# Patient Record
Sex: Male | Born: 1935 | Race: White | Hispanic: No | Marital: Married | Smoking: Never smoker
Health system: Southern US, Community
[De-identification: ages and names within clinical notes are randomized; demographics above are authoritative.]

## PROBLEM LIST (undated history)

## (undated) DIAGNOSIS — F0390 Unspecified dementia without behavioral disturbance: Secondary | ICD-10-CM

---

## 2000-06-07 ENCOUNTER — Other Ambulatory Visit: Admission: RE | Admit: 2000-06-07 | Discharge: 2000-06-07 | Payer: Self-pay | Admitting: Internal Medicine

## 2000-06-07 ENCOUNTER — Encounter (INDEPENDENT_AMBULATORY_CARE_PROVIDER_SITE_OTHER): Payer: Self-pay | Admitting: Specialist

## 2004-04-30 ENCOUNTER — Ambulatory Visit: Payer: Self-pay | Admitting: Internal Medicine

## 2004-07-01 ENCOUNTER — Ambulatory Visit: Payer: Self-pay | Admitting: Internal Medicine

## 2004-10-29 ENCOUNTER — Ambulatory Visit: Payer: Self-pay | Admitting: Internal Medicine

## 2005-04-29 ENCOUNTER — Ambulatory Visit: Payer: Self-pay | Admitting: Internal Medicine

## 2005-05-17 ENCOUNTER — Encounter: Admission: RE | Admit: 2005-05-17 | Discharge: 2005-05-17 | Payer: Self-pay | Admitting: Orthopaedic Surgery

## 2005-06-02 ENCOUNTER — Encounter: Admission: RE | Admit: 2005-06-02 | Discharge: 2005-06-02 | Payer: Self-pay | Admitting: Orthopaedic Surgery

## 2005-06-14 ENCOUNTER — Ambulatory Visit: Payer: Self-pay | Admitting: Internal Medicine

## 2005-06-16 ENCOUNTER — Ambulatory Visit: Payer: Self-pay | Admitting: Internal Medicine

## 2005-07-09 ENCOUNTER — Ambulatory Visit: Payer: Self-pay | Admitting: Internal Medicine

## 2005-10-28 ENCOUNTER — Ambulatory Visit: Payer: Self-pay | Admitting: Internal Medicine

## 2006-03-31 ENCOUNTER — Ambulatory Visit: Payer: Self-pay | Admitting: Internal Medicine

## 2006-06-01 ENCOUNTER — Encounter: Payer: Self-pay | Admitting: Internal Medicine

## 2006-07-01 ENCOUNTER — Ambulatory Visit: Payer: Self-pay | Admitting: Internal Medicine

## 2006-07-08 ENCOUNTER — Inpatient Hospital Stay: Payer: Self-pay | Admitting: Internal Medicine

## 2006-07-08 ENCOUNTER — Other Ambulatory Visit: Payer: Self-pay

## 2006-07-11 ENCOUNTER — Encounter: Payer: Self-pay | Admitting: Internal Medicine

## 2006-07-18 ENCOUNTER — Telehealth: Payer: Self-pay | Admitting: Internal Medicine

## 2006-07-25 ENCOUNTER — Encounter: Payer: Self-pay | Admitting: Internal Medicine

## 2006-09-14 DIAGNOSIS — H8110 Benign paroxysmal vertigo, unspecified ear: Secondary | ICD-10-CM | POA: Insufficient documentation

## 2006-09-14 DIAGNOSIS — Z860100 Personal history of colon polyps, unspecified: Secondary | ICD-10-CM | POA: Insufficient documentation

## 2006-09-14 DIAGNOSIS — M479 Spondylosis, unspecified: Secondary | ICD-10-CM | POA: Insufficient documentation

## 2006-09-14 DIAGNOSIS — F528 Other sexual dysfunction not due to a substance or known physiological condition: Secondary | ICD-10-CM

## 2006-09-14 DIAGNOSIS — J309 Allergic rhinitis, unspecified: Secondary | ICD-10-CM | POA: Insufficient documentation

## 2006-09-14 DIAGNOSIS — M4800 Spinal stenosis, site unspecified: Secondary | ICD-10-CM

## 2006-09-14 DIAGNOSIS — F411 Generalized anxiety disorder: Secondary | ICD-10-CM | POA: Insufficient documentation

## 2015-03-01 ENCOUNTER — Encounter: Payer: Self-pay | Admitting: *Deleted

## 2015-03-01 ENCOUNTER — Emergency Department: Payer: Medicare Other

## 2015-03-01 ENCOUNTER — Inpatient Hospital Stay
Admission: EM | Admit: 2015-03-01 | Discharge: 2015-03-02 | DRG: 438 | Disposition: A | Discharge status: Other Acute Inpatient Hospital | Payer: Medicare Other | Attending: Surgery | Admitting: Surgery

## 2015-03-01 DIAGNOSIS — S3690XA Unspecified injury of unspecified intra-abdominal organ, initial encounter: Secondary | ICD-10-CM | POA: Diagnosis not present

## 2015-03-01 DIAGNOSIS — K859 Acute pancreatitis without necrosis or infection, unspecified: Secondary | ICD-10-CM | POA: Diagnosis not present

## 2015-03-01 DIAGNOSIS — Z4659 Encounter for fitting and adjustment of other gastrointestinal appliance and device: Secondary | ICD-10-CM

## 2015-03-01 DIAGNOSIS — IMO0001 Reserved for inherently not codable concepts without codable children: Secondary | ICD-10-CM

## 2015-03-01 DIAGNOSIS — R58 Hemorrhage, not elsewhere classified: Secondary | ICD-10-CM

## 2015-03-01 DIAGNOSIS — R109 Unspecified abdominal pain: Secondary | ICD-10-CM | POA: Diagnosis present

## 2015-03-01 DIAGNOSIS — K8689 Other specified diseases of pancreas: Secondary | ICD-10-CM | POA: Diagnosis present

## 2015-03-01 DIAGNOSIS — K661 Hemoperitoneum: Secondary | ICD-10-CM | POA: Diagnosis present

## 2015-03-01 DIAGNOSIS — R1084 Generalized abdominal pain: Secondary | ICD-10-CM

## 2015-03-01 DIAGNOSIS — N179 Acute kidney failure, unspecified: Secondary | ICD-10-CM | POA: Diagnosis present

## 2015-03-01 DIAGNOSIS — S301XXA Contusion of abdominal wall, initial encounter: Secondary | ICD-10-CM | POA: Insufficient documentation

## 2015-03-01 DIAGNOSIS — S3692XA Contusion of unspecified intra-abdominal organ, initial encounter: Secondary | ICD-10-CM

## 2015-03-01 DIAGNOSIS — R000 Tachycardia, unspecified: Secondary | ICD-10-CM | POA: Diagnosis present

## 2015-03-01 LAB — LACTIC ACID, PLASMA
LACTIC ACID, VENOUS: 4 mmol/L — AB (ref 0.5–2.0)
Lactic Acid, Venous: 3.5 mmol/L (ref 0.5–2.0)

## 2015-03-01 LAB — CBC WITH DIFFERENTIAL/PLATELET
BASOS PCT: 0 %
Basophils Absolute: 0 10*3/uL (ref 0–0.1)
EOS ABS: 0 10*3/uL (ref 0–0.7)
Eosinophils Relative: 0 %
HEMATOCRIT: 36.7 % — AB (ref 40.0–52.0)
HEMOGLOBIN: 12 g/dL — AB (ref 13.0–18.0)
Lymphocytes Relative: 3 %
Lymphs Abs: 0.5 10*3/uL — ABNORMAL LOW (ref 1.0–3.6)
MCH: 30.2 pg (ref 26.0–34.0)
MCHC: 32.6 g/dL (ref 32.0–36.0)
MCV: 92.6 fL (ref 80.0–100.0)
MONOS PCT: 7 %
Monocytes Absolute: 1.2 10*3/uL — ABNORMAL HIGH (ref 0.2–1.0)
NEUTROS ABS: 15.3 10*3/uL — AB (ref 1.4–6.5)
NEUTROS PCT: 90 %
Platelets: 192 10*3/uL (ref 150–440)
RBC: 3.97 MIL/uL — AB (ref 4.40–5.90)
RDW: 13.6 % (ref 11.5–14.5)
WBC: 17.1 10*3/uL — AB (ref 3.8–10.6)

## 2015-03-01 LAB — COMPREHENSIVE METABOLIC PANEL
ALT: 9 U/L — AB (ref 17–63)
AST: 32 U/L (ref 15–41)
Albumin: 3.8 g/dL (ref 3.5–5.0)
Alkaline Phosphatase: 69 U/L (ref 38–126)
Anion gap: 12 (ref 5–15)
BUN: 40 mg/dL — AB (ref 6–20)
CHLORIDE: 101 mmol/L (ref 101–111)
CO2: 25 mmol/L (ref 22–32)
CREATININE: 2.18 mg/dL — AB (ref 0.61–1.24)
Calcium: 8.9 mg/dL (ref 8.9–10.3)
GFR calc Af Amer: 30 mL/min — ABNORMAL LOW (ref >60)
GFR calc non Af Amer: 26 mL/min — ABNORMAL LOW (ref >60)
Glucose, Bld: 176 mg/dL — ABNORMAL HIGH (ref 65–99)
POTASSIUM: 5 mmol/L (ref 3.5–5.1)
Sodium: 138 mmol/L (ref 135–145)
Total Bilirubin: 0.8 mg/dL (ref 0.3–1.2)
Total Protein: 6.3 g/dL — ABNORMAL LOW (ref 6.5–8.1)

## 2015-03-01 LAB — URINALYSIS COMPLETE WITH MICROSCOPIC (ARMC ONLY)
BACTERIA UA: NONE SEEN
Bilirubin Urine: NEGATIVE
Glucose, UA: NEGATIVE mg/dL
HGB URINE DIPSTICK: NEGATIVE
KETONES UR: NEGATIVE mg/dL
LEUKOCYTES UA: NEGATIVE
NITRITE: NEGATIVE
PH: 5 (ref 5.0–8.0)
PROTEIN: 30 mg/dL — AB
SPECIFIC GRAVITY, URINE: 1.016 (ref 1.005–1.030)
Squamous Epithelial / LPF: NONE SEEN

## 2015-03-01 LAB — BASIC METABOLIC PANEL
Anion gap: 8 (ref 5–15)
BUN: 37 mg/dL — ABNORMAL HIGH (ref 6–20)
CALCIUM: 8.1 mg/dL — AB (ref 8.9–10.3)
CO2: 23 mmol/L (ref 22–32)
CREATININE: 1.79 mg/dL — AB (ref 0.61–1.24)
Chloride: 108 mmol/L (ref 101–111)
GFR, EST AFRICAN AMERICAN: 38 mL/min — AB (ref >60)
GFR, EST NON AFRICAN AMERICAN: 33 mL/min — AB (ref >60)
Glucose, Bld: 144 mg/dL — ABNORMAL HIGH (ref 65–99)
Potassium: 5.4 mmol/L — ABNORMAL HIGH (ref 3.5–5.1)
Sodium: 139 mmol/L (ref 135–145)

## 2015-03-01 LAB — ABO/RH: ABO/RH(D): B NEG

## 2015-03-01 LAB — TYPE AND SCREEN
ABO/RH(D): B NEG
ANTIBODY SCREEN: NEGATIVE

## 2015-03-01 LAB — TROPONIN I: TROPONIN I: 0.03 ng/mL (ref <0.031)

## 2015-03-01 MED ORDER — DEXTROSE-NACL 5-0.45 % IV SOLN
INTRAVENOUS | Status: DC
  Administered 2015-03-01 – 2015-03-02 (×3): via INTRAVENOUS

## 2015-03-01 MED ORDER — MORPHINE SULFATE (PF) 2 MG/ML IV SOLN
2.0000 mg | INTRAVENOUS | Status: DC | PRN
  Administered 2015-03-02 (×2): 2 mg via INTRAVENOUS
  Filled 2015-03-01 (×2): qty 1

## 2015-03-01 MED ORDER — ONDANSETRON HCL 4 MG PO TABS: 4.0000 mg | ORAL_TABLET | Freq: Four times a day (QID) | ORAL | Status: DC | PRN

## 2015-03-01 MED ORDER — MORPHINE SULFATE (PF) 4 MG/ML IV SOLN
4.0000 mg | Freq: Once | INTRAVENOUS | Status: AC
  Administered 2015-03-01: 4 mg via INTRAVENOUS
  Filled 2015-03-01: qty 1

## 2015-03-01 MED ORDER — ONDANSETRON HCL 4 MG/2ML IJ SOLN: 4.0000 mg | Freq: Four times a day (QID) | INTRAMUSCULAR | Status: DC | PRN

## 2015-03-01 MED ORDER — IOHEXOL 240 MG/ML SOLN: 25.0000 mL | Freq: Once | INTRAMUSCULAR | Status: DC | PRN

## 2015-03-01 MED ORDER — IOHEXOL 240 MG/ML SOLN
25.0000 mL | Freq: Once | INTRAMUSCULAR | Status: AC | PRN
  Administered 2015-03-01: 25 mL via ORAL

## 2015-03-01 MED ORDER — PANTOPRAZOLE SODIUM 40 MG IV SOLR
40.0000 mg | Freq: Two times a day (BID) | INTRAVENOUS | Status: DC
  Administered 2015-03-01 – 2015-03-02 (×2): 40 mg via INTRAVENOUS
  Filled 2015-03-01 (×2): qty 40

## 2015-03-01 MED ORDER — SODIUM CHLORIDE 0.9 % IV SOLN
Freq: Once | INTRAVENOUS | Status: AC
  Administered 2015-03-01: 2000 mL via INTRAVENOUS

## 2015-03-01 MED ORDER — PIPERACILLIN-TAZOBACTAM 3.375 G IVPB
3.3750 g | Freq: Once | INTRAVENOUS | Status: AC
  Administered 2015-03-01: 3.375 g via INTRAVENOUS
  Filled 2015-03-01: qty 50

## 2015-03-01 MED ORDER — CLONAZEPAM 0.5 MG PO TABS
0.5000 mg | ORAL_TABLET | Freq: Two times a day (BID) | ORAL | Status: DC
  Administered 2015-03-02: 0.5 mg via ORAL
  Filled 2015-03-01: qty 1

## 2015-03-01 MED ORDER — PNEUMOCOCCAL VAC POLYVALENT 25 MCG/0.5ML IJ INJ
0.5000 mL | INJECTION | INTRAMUSCULAR | Status: DC
Start: 2015-03-02 — End: 2015-03-01

## 2015-03-01 MED ORDER — SODIUM CHLORIDE 0.9 % IV BOLUS (SEPSIS)
1000.0000 mL | Freq: Once | INTRAVENOUS | Status: AC
  Administered 2015-03-01: 1000 mL via INTRAVENOUS

## 2015-03-01 MED ORDER — INFLUENZA VAC SPLIT QUAD 0.5 ML IM SUSY: 0.5000 mL | PREFILLED_SYRINGE | INTRAMUSCULAR | Status: DC

## 2015-03-01 NOTE — Progress Notes (Signed)
Visited patient in ICU. Patient has abdominal pain but no worse and has not been medicated. No further nausea.  Neither NG tube nor Foley has been placed yet. Systolic blood pressure 155 heart rate 95 No change in exam awake alert and oriented  Wife at bedside  Discuss plans again with them and with RN awaiting placement of NG and Foley catheter as well as labs to be drawn within the next hour.

## 2015-03-01 NOTE — H&P (Signed)
Joel Mcconnell is an 82 y.o. male.    Chief Complaint: Abdominal pain  HPI: This is a 46-year-old male patient with no other serious medical problems not anticoagulated who presents with acute abdominal pain starting gradually 3 days ago it has gradually worsened. He has not had a bowel movement in 2 days had vomiting yesterday but none today he is nauseated. He's never had an episode like this before and denies fevers or chills. Denies melena or hematochezia and no hematemesis  No past medical history on file. past medical history is documented elsewhere in the chart but he denies cardiac disease hypertension myocardial infarction or stroke and is not diabetic  No past surgical history on file. he has had knee surgeries but no abdominal surgery  No family history on file. there is no significant family history Social History:  reports that he has never smoked. He does not have any smokeless tobacco history on file. He reports that he does not drink alcohol. His drug history is not on file. He worked as a Associate Professor in a city [employer] Allergies: No Known Allergies   (Not in a hospital admission)   Review of Systems  Constitutional: Negative for fever and chills.  HENT: Negative.   Eyes: Negative.   Respiratory: Negative.   Cardiovascular: Negative.   Gastrointestinal: Positive for nausea, vomiting, abdominal pain and constipation. Negative for heartburn, diarrhea, blood in stool and melena.  Genitourinary: Negative.   Musculoskeletal: Negative.   Skin: Negative.   Neurological: Negative.   Endo/Heme/Allergies: Negative.   Psychiatric/Behavioral: Negative.      Physical Exam:  BP 139/78 mmHg  Pulse 95  Temp(Src) 98.1 F (36.7 C) (Oral)  Resp 20  Ht 6' 2"  (1.88 m)  Wt 187 lb 4.8 oz (84.959 kg)  BMI 24.04 kg/m2  SpO2 94%  Physical Exam  Constitutional: He is oriented to person, place, and time and well-developed, well-nourished, and in no distress. No distress.  Elderly  in no acute distress  Heart rate has improved with fluid resuscitation from the 120s to the low 40N and systolic blood pressures 027  HENT:  Head: Normocephalic and atraumatic.  Eyes: Pupils are equal, round, and reactive to light. Right eye exhibits no discharge. Left eye exhibits no discharge. No scleral icterus.  Neck: Normal range of motion. Neck supple.  Cardiovascular: Normal rate, regular rhythm and normal heart sounds.   No murmur heard. Mild tachycardia at 94  Pulmonary/Chest: Effort normal and breath sounds normal. No respiratory distress. He has no wheezes. He has no rales.  Abdominal: Soft. He exhibits distension. There is tenderness. There is no rebound and no guarding.  Distended nontympanitic minimally tender diffusely without peritoneal signs no guarding rebound or percussion tenderness  Genitourinary: Penis normal.  Musculoskeletal: Normal range of motion. He exhibits no edema or tenderness.  Lymphadenopathy:    He has no cervical adenopathy.  Neurological: He is alert and oriented to person, place, and time.  Skin: Skin is warm and dry. No rash noted. He is not diaphoretic. No erythema.  Psychiatric: Mood and affect normal.  Vitals reviewed.       Results for orders placed or performed during the hospital encounter of 03/01/15 (from the past 48 hour(s))  Comprehensive metabolic panel     Status: Abnormal   Collection Time: 03/01/15  4:56 PM  Result Value Ref Range   Sodium 138 135 - 145 mmol/L   Potassium 5.0 3.5 - 5.1 mmol/L   Chloride 101 101 - 111  mmol/L   CO2 25 22 - 32 mmol/L   Glucose, Bld 176 (H) 65 - 99 mg/dL   BUN 40 (H) 6 - 20 mg/dL   Creatinine, Ser 2.18 (H) 0.61 - 1.24 mg/dL   Calcium 8.9 8.9 - 10.3 mg/dL   Total Protein 6.3 (L) 6.5 - 8.1 g/dL   Albumin 3.8 3.5 - 5.0 g/dL   AST 32 15 - 41 U/L   ALT 9 (L) 17 - 63 U/L   Alkaline Phosphatase 69 38 - 126 U/L   Total Bilirubin 0.8 0.3 - 1.2 mg/dL   GFR calc non Af Amer 26 (L) >60 mL/min   GFR  calc Af Amer 30 (L) >60 mL/min    Comment: (NOTE) The eGFR has been calculated using the CKD EPI equation. This calculation has not been validated in all clinical situations. eGFR's persistently <60 mL/min signify possible Chronic Kidney Disease.    Anion gap 12 5 - 15  Troponin I     Status: None   Collection Time: 03/01/15  4:56 PM  Result Value Ref Range   Troponin I 0.03 <0.031 ng/mL    Comment:        NO INDICATION OF MYOCARDIAL INJURY.   Lactic acid, plasma     Status: Abnormal   Collection Time: 03/01/15  4:56 PM  Result Value Ref Range   Lactic Acid, Venous 4.0 (HH) 0.5 - 2.0 mmol/L    Comment: CRITICAL RESULT CALLED TO, READ BACK BY AND VERIFIED WITH  [facility] WEAVER AT 1731 03/01/15 SDR   CBC with Differential     Status: Abnormal   Collection Time: 03/01/15  4:56 PM  Result Value Ref Range   WBC 17.1 (H) 3.8 - 10.6 K/uL   RBC 3.97 (L) 4.40 - 5.90 MIL/uL   Hemoglobin 12.0 (L) 13.0 - 18.0 g/dL   HCT 36.7 (L) 40.0 - 52.0 %   MCV 92.6 80.0 - 100.0 fL   MCH 30.2 26.0 - 34.0 pg   MCHC 32.6 32.0 - 36.0 g/dL   RDW 13.6 11.5 - 14.5 %   Platelets 192 150 - 440 K/uL   Neutrophils Relative % 90 %   Neutro Abs 15.3 (H) 1.4 - 6.5 K/uL   Lymphocytes Relative 3 %   Lymphs Abs 0.5 (L) 1.0 - 3.6 K/uL   Monocytes Relative 7 %   Monocytes Absolute 1.2 (H) 0.2 - 1.0 K/uL   Eosinophils Relative 0 %   Eosinophils Absolute 0.0 0 - 0.7 K/uL   Basophils Relative 0 %   Basophils Absolute 0.0 0 - 0.1 K/uL  Urinalysis complete, with microscopic     Status: Abnormal   Collection Time: 03/01/15  4:56 PM  Result Value Ref Range   Color, Urine YELLOW (A) YELLOW   APPearance CLEAR (A) CLEAR   Glucose, UA NEGATIVE NEGATIVE mg/dL   Bilirubin Urine NEGATIVE NEGATIVE   Ketones, ur NEGATIVE NEGATIVE mg/dL   Specific Gravity, Urine 1.016 1.005 - 1.030   Hgb urine dipstick NEGATIVE NEGATIVE   pH 5.0 5.0 - 8.0   Protein, ur 30 (A) NEGATIVE mg/dL   Nitrite NEGATIVE NEGATIVE   Leukocytes, UA  NEGATIVE NEGATIVE   RBC / HPF 0-5 0 - 5 RBC/hpf   WBC, UA 0-5 0 - 5 WBC/hpf   Bacteria, UA NONE SEEN NONE SEEN   Squamous Epithelial / LPF NONE SEEN NONE SEEN   Mucous PRESENT   Lactic acid, plasma     Status: Abnormal   Collection Time:  03/01/15  7:25 PM  Result Value Ref Range   Lactic Acid, Venous 3.5 (HH) 0.5 - 2.0 mmol/L    Comment: CRITICAL RESULT CALLED TO, READ BACK BY AND VERIFIED WITH IRIS GUIDRY AT 2006 ON 03/01/15 RWW   Type and screen [facility]     Status: None (Preliminary result)   Collection Time: 03/01/15  7:44 PM  Result Value Ref Range   ABO/RH(D) PENDING    Antibody Screen PENDING    Sample Expiration 03/04/2015    Ct Abdomen Pelvis Wo Contrast  03/01/2015  CLINICAL DATA:  Abdominal pain for 3 days EXAM: CT ABDOMEN AND PELVIS WITHOUT CONTRAST TECHNIQUE: Multidetector CT imaging of the abdomen and pelvis was performed following the standard protocol without IV contrast. COMPARISON:  None. FINDINGS: Lower chest:  Mild bibasilar atelectasis worse on the right Hepatobiliary: Negative Pancreas: Limited evaluation without contrast but no definite inherent abnormalities. Area of pancreatic head, adjacent duodenum difficult to evaluate due to evidence of probable hematoma in the area measuring about 6 by 5 cm. Spleen: Negative Adrenals/Urinary Tract: Adrenal glands are negative. Bilateral renal atrophy. Numerous hyper attenuating tiny renal lesions bilaterally likely complex cysts. Stomach/Bowel: Nonobstructive bowel gas pattern. Stool throughout the large bowel. 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 Reproductive: Negative Other: There is relatively hyper attenuating ascites in the abdomen and pelvis. Attenuation value is approximately 42. In the right upper quadrant, there appears to be hyper attenuating fluid surrounding the descending duodenum. Free fluid is seen in  the bilateral upper quadrants around the liver and spleen, with inflammatory change in the retroperitoneum around the pancreas and fluid extending into the lesser sac. Free fluid extends along both pericolic gutters into the pelvis. Musculoskeletal: No acute findings IMPRESSION: Moderate to large volume hemoperitoneum. Site of hemorrhage on certain but may be in the right upper quadrant near the duodenum and pancreatic head. Possibility of bleeding peptic ulcer is considered. Critical Value/emergent results were called by telephone at the time of interpretation on 03/01/2015 at 7:29 pm to Dr. Conni Slipper , who verbally acknowledged these results. Electronically Signed   By: Skipper Cliche M.D.   On: 03/01/2015 19:29   Dg Chest Portable 1 View  03/01/2015  CLINICAL DATA:  Epigastric pain.  Nausea and vomiting. EXAM: PORTABLE CHEST 1 VIEW COMPARISON:  07/08/2007 FINDINGS: Normal cardiac silhouette. There are low lung volumes which are decreased compared to prior. There is chronic elevation of LEFT hemidiaphragm. Lung bases are poorly evaluated. Upper lobes are clear IMPRESSION: 1. Interval decrease in lung volumes. 2. Upper lobes are clear. Electronically Signed   By: Suzy Bouchard M.D.   On: 03/01/2015 16:55     Assessment/Plan  cT scan is personally reviewed.  I reviewed by telephone the CT scan findings with Dr. Trula Slade vascular surgery on call who personally reviewed the films as well. We're in agreement that this is likely of venous hemorrhage that has stopped as it's been going on for 3 days with abdominal pain with a hemoglobin that is not critically low. He has responded to minimal fluid resuscitation of 2 L. His scan was noncontrast so it is difficult to identify the exact lesion but this is likely venous although it could be a pseudoaneurysm but is likely a recurrent emanating from the mesentery in the upper abdomen.  Dr. Trula Slade and I are in agreement that resuscitation and improvement in his  creatinine may allow [place] to  perform a CT and she'll with contrast or an angiogram with contrast tomorrow to better delineate the etiology of this. At this point there are no acute surgical indications but he will be admitted to the hospital ICU for observation and a nasogastric tube and Foley catheter will be placed.  This is discussed with the emergency room physician and staff as well as the family who were in agreement with this plan. Her Brabham has agreed to see the patient in the morning unless needed to be seen emergently which I do not believe is necessary. I will recheck his creatinine later tonight.   Florene Glen, MD, FACS

## 2015-03-01 NOTE — Progress Notes (Signed)
eLink Physician-Brief Progress Note Patient Name: Joel Mcconnell DOB: December 23, 1927 MRN: [id number]   Date of Service  03/01/2015  HPI/Events of Note  New patient w/ hemoperitoneum on CT imaging. Possible bleeding ulcer. BP stable & patient mildly tachy to 104. Camera chk shows patient on room air & comfortable. RN preparing to pass NGT. Vascular Surgery Aware as well.  eICU Interventions  Plan as per Admitting Physician.     Intervention Category Evaluation Type: New Patient Evaluation  Lawanda Cousins 03/01/2015, 11:45 PM

## 2015-03-01 NOTE — ED Notes (Signed)
Dr Excell Seltzer in to see family.  This nurse confirmed if any further questions the family had.  Pt resting comfortably.  Bair Hugger in place.  Lactic level given to Dr Darnelle Catalan.

## 2015-03-01 NOTE — ED Notes (Signed)
Per EMS report, patient has c/o mid-abdominal pain for 3 days. Patient is unable to remember address, allergies, had difficulty writing name, but did remember name and birthday. Per EMS report patient does not have a history of dementia. Patient c/o RLQ pain when moved to stretcher. Patient  Reports vomiting x2.

## 2015-03-01 NOTE — ED Provider Notes (Signed)
[facility] Emergency Department Provider Note  ____________________________________________  Time seen: Approximately 4:49 PM  I have reviewed the triage vital signs and the nursing notes.   HISTORY  Chief Complaint Abdominal Pain    HPI Joel Mcconnell is a 82 y.o. male who complains of 3 days of upper abdominal pain. He's had some vomiting as well. Reports pain is sharp and sometimes crampy. She denies any diarrhea. Patient denies any fever. Patient reports pain is severe at times including now. Patient cannot tell me what makes the pain better or worse.   No past medical history on file.  Patient Active Problem List   Diagnosis Date Noted  . ANXIETY 09/14/2006  . ERECTILE DYSFUNCTION 09/14/2006  . BENIGN POSITIONAL VERTIGO 09/14/2006  . ALLERGIC RHINITIS 09/14/2006  . SPONDYLOSIS 09/14/2006  . SPINAL STENOSIS 09/14/2006  . COLONIC POLYPS, HX OF 09/14/2006    No past surgical history on file.  Current Outpatient Rx  Name  Route  Sig  Dispense  Refill  . carisoprodol (SOMA) 350 MG tablet   Oral   Take 350 mg by mouth daily as needed. For muscle relaxer.         . cetirizine (ZYRTEC) 10 MG tablet   Oral   Take 10 mg by mouth daily as needed. For anxiety.         . clonazePAM (KLONOPIN) 0.5 MG tablet   Oral   Take 0.5 mg by mouth daily as needed. For anxiety.           Allergies Review of patient's allergies indicates no known allergies.  No family history on file.  Social History Social History  Substance Use Topics  . Smoking status: Never Smoker   . Smokeless tobacco: None  . Alcohol Use: No    Review of Systems Constitutional: No fever/chills Eyes: No visual changes. ENT: No sore throat. Cardiovascular: Denies chest pain. Respiratory: Denies shortness of breath. Gastrointestinal: See history of present illness Genitourinary: Negative for dysuria. Musculoskeletal: Negative for back pain. Skin: Negative for  rash. Neurological: Negative for headaches, focal weakness or numbness.  10-point ROS otherwise negative.  ____________________________________________   PHYSICAL EXAM:  VITAL SIGNS: ED Triage Vitals  Enc Vitals Group     BP 03/01/15 1628 117/78 mmHg     Pulse Rate 03/01/15 1628 109     Resp 03/01/15 1628 20     Temp 03/01/15 1628 97.9 F (36.6 C)     Temp Source 03/01/15 1628 Oral     SpO2 03/01/15 1628 95 %     Weight 03/01/15 1628 187 lb 4.8 oz (84.959 kg)     Height 03/01/15 1628  (1.88 m)     Head Cir --      Peak Flow --      Pain Score --      Pain Loc --      Pain Edu? --      Excl. in GC? --     Constitutional: Alert and oriented. Eyes: Conjunctivae are normal. PERRL. EOMI. Head: Atraumatic. Nose: No congestion/rhinnorhea. Mouth/Throat: Mucous membranes are moist.  Oropharynx non-erythematous. Neck: No stridor. Cardiovascular: Normal rate, regular rhythm. Grossly normal heart sounds.  Good peripheral circulation. Respiratory: Normal respiratory effort.  No retractions. Lungs CTAB. Gastrointestinal: Soft tender across the upper abdomen. It's tender to palpation even light palpation and percussion. No distention. No abdominal bruits. No CVA tenderness. Musculoskeletal: No lower extremity tenderness trace edema.  No joint effusions. Neurologic:  Normal speech  and language. No gross focal neurologic deficits are appreciated.  Skin:  Skin is warm, dry and intact. Patient has venous stasis changes in his legs Psychiatric: Mood and affect are normal. Speech and behavior are normal.  ____________________________________________   LABS (all labs ordered are listed, but only abnormal results are displayed)  Labs Reviewed  COMPREHENSIVE METABOLIC PANEL - Abnormal; Notable for the following:    Glucose, Bld 176 (*)    BUN 40 (*)    Creatinine, Ser 2.18 (*)    Total Protein 6.3 (*)    ALT 9 (*)    GFR calc non Af Amer 26 (*)    GFR calc Af Amer 30 (*)    All  other components within normal limits  LACTIC ACID, PLASMA - Abnormal; Notable for the following:    Lactic Acid, Venous 4.0 (*)    All other components within normal limits  CBC WITH DIFFERENTIAL/PLATELET - Abnormal; Notable for the following:    WBC 17.1 (*)    RBC 3.97 (*)    Hemoglobin 12.0 (*)    HCT 36.7 (*)    Neutro Abs 15.3 (*)    Lymphs Abs 0.5 (*)    Monocytes Absolute 1.2 (*)    All other components within normal limits  URINALYSIS COMPLETEWITH MICROSCOPIC ([facility] ONLY) - Abnormal; Notable for the following:    Color, Urine YELLOW (*)    APPearance CLEAR (*)    Protein, ur 30 (*)    All other components within normal limits  CULTURE, BLOOD (ROUTINE X 2)  CULTURE, BLOOD (ROUTINE X 2)  URINE CULTURE  TROPONIN I  LACTIC ACID, PLASMA  TYPE AND SCREEN   ____________________________________________  EKG  EKG read and interpreted by me shows sinus tachycardia rate of 106 normal axis nonspecific ST-T wave changes is possible the V1 and V2 of been reversed ____________________________________________  RADIOLOGY  Radiologist calls back and reports the patient has a complex hematoma around the duodenum and has the pancreas ____________________________________________   PROCEDURES    ____________________________________________   INITIAL IMPRESSION / ASSESSMENT AND PLAN / ED COURSE  Pertinent labs & imaging results that were available during my care of the patient were reviewed by me and considered in my medical decision making (see chart for details).   ____________________________________________   FINAL CLINICAL IMPRESSION(S) / ED DIAGNOSES  Final diagnoses:  Abdominal pain, unspecified abdominal location  Abdominal hematoma, initial encounter      Arnaldo Natal, MD 03/01/15 [phone]

## 2015-03-02 ENCOUNTER — Inpatient Hospital Stay: Payer: Medicare Other

## 2015-03-02 ENCOUNTER — Ambulatory Visit (HOSPITAL_COMMUNITY)
Admission: AD | Admit: 2015-03-02 | Discharge: 2015-03-02 | Disposition: A | Discharge status: Home / Self Care | Payer: Medicare Other | Source: Other Acute Inpatient Hospital | Attending: Surgical Oncology | Admitting: Surgical Oncology

## 2015-03-02 ENCOUNTER — Encounter: Payer: Self-pay | Admitting: Radiology

## 2015-03-02 DIAGNOSIS — R109 Unspecified abdominal pain: Secondary | ICD-10-CM | POA: Insufficient documentation

## 2015-03-02 LAB — CBC
HCT: 31.9 % — ABNORMAL LOW (ref 40.0–52.0)
Hemoglobin: 10.7 g/dL — ABNORMAL LOW (ref 13.0–18.0)
MCH: 30.6 pg (ref 26.0–34.0)
MCHC: 33.4 g/dL (ref 32.0–36.0)
MCV: 91.4 fL (ref 80.0–100.0)
PLATELETS: 158 10*3/uL (ref 150–440)
RBC: 3.49 MIL/uL — ABNORMAL LOW (ref 4.40–5.90)
RDW: 13.5 % (ref 11.5–14.5)
WBC: 19.6 10*3/uL — AB (ref 3.8–10.6)

## 2015-03-02 LAB — COMPREHENSIVE METABOLIC PANEL
ALBUMIN: 3.2 g/dL — AB (ref 3.5–5.0)
ALT: 6 U/L — ABNORMAL LOW (ref 17–63)
ANION GAP: 8 (ref 5–15)
AST: 20 U/L (ref 15–41)
Alkaline Phosphatase: 49 U/L (ref 38–126)
BUN: 33 mg/dL — ABNORMAL HIGH (ref 6–20)
CO2: 24 mmol/L (ref 22–32)
Calcium: 8.1 mg/dL — ABNORMAL LOW (ref 8.9–10.3)
Chloride: 107 mmol/L (ref 101–111)
Creatinine, Ser: 1.75 mg/dL — ABNORMAL HIGH (ref 0.61–1.24)
GFR calc non Af Amer: 34 mL/min — ABNORMAL LOW (ref >60)
GFR, EST AFRICAN AMERICAN: 39 mL/min — AB (ref >60)
GLUCOSE: 149 mg/dL — AB (ref 65–99)
Potassium: 4.6 mmol/L (ref 3.5–5.1)
SODIUM: 139 mmol/L (ref 135–145)
TOTAL PROTEIN: 5.5 g/dL — AB (ref 6.5–8.1)
Total Bilirubin: 0.6 mg/dL (ref 0.3–1.2)

## 2015-03-02 LAB — MRSA PCR SCREENING: MRSA by PCR: NEGATIVE

## 2015-03-02 LAB — AMYLASE: Amylase: 927 U/L — ABNORMAL HIGH (ref 28–100)

## 2015-03-02 LAB — HEMOGLOBIN: Hemoglobin: 10.1 g/dL — ABNORMAL LOW (ref 13.0–18.0)

## 2015-03-02 LAB — LIPASE, BLOOD: Lipase: 1255 U/L — ABNORMAL HIGH (ref 11–51)

## 2015-03-02 MED ORDER — INFLUENZA VAC SPLIT QUAD 0.5 ML IM SUSY: 0.5000 mL | PREFILLED_SYRINGE | INTRAMUSCULAR | Status: DC | PRN

## 2015-03-02 MED ORDER — IOHEXOL 350 MG/ML SOLN
80.0000 mL | Freq: Once | INTRAVENOUS | Status: AC | PRN
  Administered 2015-03-02: 80 mL via INTRAVENOUS

## 2015-03-02 MED ORDER — SODIUM CHLORIDE 0.9 % IV BOLUS (SEPSIS)
1000.0000 mL | Freq: Once | INTRAVENOUS | Status: AC
  Administered 2015-03-02: 1000 mL via INTRAVENOUS

## 2015-03-02 NOTE — Progress Notes (Addendum)
82 yr old with retroperitoneal hemorrhage, just went for CT angio this AM.  I received a call from the interventional radiologist reading the scan, Dr. Lowella Dandy,  stating he had hemorrhagic pancreatitis with increase in fluid and contrast seen in the retroperitoneum and abdomen.   Filed Vitals:   03/02/15 1000 03/02/15 1100  BP: 153/76 169/81  Pulse:  103  Temp:    Resp: 22 23     CBC Latest Ref Rng 03/02/2015 03/01/2015  WBC 3.8 - 10.6 K/uL 19.6(H) 17.1(H)  Hemoglobin 13.0 - 18.0 g/dL 10.7(L) 12.0(L)  Hematocrit 40.0 - 52.0 % 31.9(L) 36.7(L)  Platelets 150 - 440 K/uL 158 192   CMP Latest Ref Rng 03/02/2015 03/01/2015 03/01/2015  Glucose 65 - 99 mg/dL 161(W) 960(A) 540(J)  BUN 6 - 20 mg/dL 81(X) 91(Y) 78(G)  Creatinine 0.61 - 1.24 mg/dL 9.56(O) 1.30(Q) 6.57(Q)  Sodium 135 - 145 mmol/L 139 139 138  Potassium 3.5 - 5.1 mmol/L 4.6 5.4(H) 5.0  Chloride 101 - 111 mmol/L 107 108 101  CO2 22 - 32 mmol/L Calcium 8.9 - 10.3 mg/dL 8.1(L) 8.1(L) 8.9  Total Protein 6.5 - 8.1 g/dL 4.6(N) - 6.3(L)  Total Bilirubin 0.3 - 1.2 mg/dL 0.6 - 0.8  Alkaline Phos 38 - 126 U/L 49 - 69  AST 15 - 41 U/L 20 - 32  ALT 17 - 63 U/L 6(L) - 9(L)    CT angio: Large amount of fluid and inflammation centered around the pancreas and duodenum. There is concern for pancreatic necrosis in the pancreatic body. Findings are suggestive for pancreatitis. Again noted is a large amount of blood and fluid within the abdomen and pelvis. Again noted is a hematoma near the second and third portions of the duodenum. Findings are suggestive for hemorrhagic Pancreatitis.  Hemoperitoneum Increased fluid and blood products in the pelvis compared to the recent comparison examination.  No evidence for an aneurysm, pseudoaneurysm or active bleeding in the region of the pancreas or duodenum.  A/P:  . Given the fact that he is still bleeding hgb 12 to 10 with hemorrhagic pancreatitis and increased bleeding on scan, will transfer  him to a tertiary care center with hepatobiliary.  Spoke with Dr. Cleon Dew at [facility], who accepted the transfer for ICU to ICU.  Awaiting call back about bed availability.  Spoke with patient and he is agreeable to plan.

## 2015-03-02 NOTE — Discharge Summary (Signed)
Physician Discharge Summary  Patient ID: Joel Mcconnell MRN: [id number] DOB/AGE: [birth year] 82 y.o.  Admit date: 03/01/2015 Discharge date: 03/02/2015  Admission Diagnoses: Hemoperitoneum   Discharge Diagnoses:  Active Problems:   Hemoperitoneum Hemorrhagic Pancreatitis   Discharged Condition: serious  Hospital Course: 82 yr old with abdominal pain, nausea and vomiting for 3 days; was evaluated in [facility] ED where non-contrasted CT performed showing retroperitoneal hemorrhage.  Patient was admitted to the ICU by Surgical service; 3 L bolus of fluid given, tachycardia improved and creatine improved from 2.2 to 1.72.  He was taken for CT angio of the abdomen and pelvis, which revealed hemorrhagic pancreatitis with increasing retroperitoneal and intraabdominal hemorrhage.  His hgb dropped from 12.1 to 10.2 during this time as well.  Decision was made that given the continued bleeding, high risks of potential decompensation and lack of hepatobiliary specialist, he should be transferred to [facility] medical center.  Dr. Kathrynn Ducking accepted as an ICU to stepdown transfer today.    Consults: vascular surgery  Significant Diagnostic Studies: labs: Hgb 12.1 to 10.1; WBC 19; Cr. 2.2 to 1.72  Treatments: IV hydration  Discharge Exam: Blood pressure 169/81, pulse 103, temperature 98.7 F (37.1 C), temperature source Oral, resp. rate 23, height  (1.88 m), weight 186 lb 1.1 oz (84.4 kg), SpO2 96 %. General appearance: alert, cooperative and no distress Resp: clear to auscultation bilaterally Cardio: tachycardia but regular rhythm  GI: soft, epigastric and upper abdominal tenderness, no guarding, peritonitis or masses Extremities: extremities normal, atraumatic, no cyanosis or edema  Disposition: Transfer to [facility] medical center step down unit     Medication List    TAKE these medications        carisoprodol 350 MG tablet  Commonly known as:  SOMA  Take 350 mg by mouth daily as  needed. For muscle relaxer.     cetirizine 10 MG tablet  Commonly known as:  ZYRTEC  Take 10 mg by mouth daily as needed. For anxiety.     clonazePAM 0.5 MG tablet  Commonly known as:  KLONOPIN  Take 0.5 mg by mouth daily as needed. For anxiety.         Signed: Laney Potash [name] 03/02/2015, 1:11 PM

## 2015-03-02 NOTE — Progress Notes (Signed)
82 yr old with retroperitoneal hemorrhage that seem to be ongoing for about 3 days. Patient states that he is having some pain in the upper part of his abdomen but it is no worse. He denies feeling any dizziness lightheadedness nausea or vomiting.  Filed Vitals:   03/02/15 0700 03/02/15 0800  BP: 136/81 141/81  Pulse: 99   Temp: 98.7 F (37.1 C)   Resp: 24 24   I/O last 3 completed shifts: In: 1160 [I.V.:1160] Out: 1050 [Urine:900; Emesis/NG output:150] Total I/O In: 143.8 [I.V.:143.8] Out: -   PE:  Gen: NAD Res: CTAB/L Cardio: RRR Abd: soft, tender in upper abdomen, no peritoneal signs, no masses GU: foley in place Ext: 2+ pulses, no edema  CBC Latest Ref Rng 03/02/2015 03/01/2015  WBC 3.8 - 10.6 K/uL 19.6(H) 17.1(H)  Hemoglobin 13.0 - 18.0 g/dL 10.7(L) 12.0(L)  Hematocrit 40.0 - 52.0 % 31.9(L) 36.7(L)  Platelets 150 - 440 K/uL 158 192   CMP Latest Ref Rng 03/02/2015 03/01/2015 03/01/2015  Glucose 65 - 99 mg/dL 409(W) 119(J) 478(G)  BUN 6 - 20 mg/dL 95(A) 21(H) 08(M)  Creatinine 0.61 - 1.24 mg/dL 5.78(I) 6.96(E) 9.52(W)  Sodium 135 - 145 mmol/L 139 139 138  Potassium 3.5 - 5.1 mmol/L 4.6 5.4(H) 5.0  Chloride 101 - 111 mmol/L 107 108 101  CO2 22 - 32 mmol/L Calcium 8.9 - 10.3 mg/dL 8.1(L) 8.1(L) 8.9  Total Protein 6.5 - 8.1 g/dL 4.1(L) - 6.3(L)  Total Bilirubin 0.3 - 1.2 mg/dL 0.6 - 0.8  Alkaline Phos 38 - 126 U/L 49 - 69  AST 15 - 41 U/L 20 - 32  ALT 17 - 63 U/L 6(L) - 9(L)    A/P:  82 yr old male with retroperitoneal hemorrhage  I spoke with Dr. Genevive Bi of radiology, who recommended CT angiogram of the abdomen and pelvis to evaluate the source of the hemorrhage. Patient has had some acute kidney injury but creatinine now at 1.75 with a baseline of 1.4. Will give a normal saline bolus prior to administering the CT scan and will evaluate with a bleeding is coming from at that time.  My partner also spoke with the vascular surgeon, Dr. Myra Gianotti, will evaluate  him later today. We'll continue nothing by mouth an NG tube with IV fluids at this time. And we'll continue to monitor his hemoglobin serially.

## 2015-03-03 LAB — URINE CULTURE: CULTURE: NO GROWTH

## 2015-03-03 LAB — GLUCOSE, CAPILLARY: GLUCOSE-CAPILLARY: 133 mg/dL — AB (ref 65–99)

## 2015-03-07 ENCOUNTER — Encounter
Admission: RE | Admit: 2015-03-07 | Discharge: 2015-03-07 | Disposition: A | Discharge status: Home / Self Care | Payer: Medicare Other | Source: Ambulatory Visit | Attending: Internal Medicine | Admitting: Internal Medicine

## 2015-03-07 DIAGNOSIS — K859 Acute pancreatitis without necrosis or infection, unspecified: Secondary | ICD-10-CM | POA: Insufficient documentation

## 2015-03-07 LAB — CULTURE, BLOOD (ROUTINE X 2)
CULTURE: NO GROWTH
Culture: NO GROWTH

## 2015-03-11 DIAGNOSIS — K859 Acute pancreatitis without necrosis or infection, unspecified: Secondary | ICD-10-CM | POA: Diagnosis not present

## 2015-03-11 LAB — CBC WITH DIFFERENTIAL/PLATELET
BASOS ABS: 0 10*3/uL (ref 0–0.1)
Basophils Relative: 0 %
Eosinophils Absolute: 0.1 10*3/uL (ref 0–0.7)
Eosinophils Relative: 1 %
HCT: 26.1 % — ABNORMAL LOW (ref 40.0–52.0)
Hemoglobin: 8.5 g/dL — ABNORMAL LOW (ref 13.0–18.0)
LYMPHS ABS: 0.8 10*3/uL — AB (ref 1.0–3.6)
Lymphocytes Relative: 6 %
MCH: 29.1 pg (ref 26.0–34.0)
MCHC: 32.6 g/dL (ref 32.0–36.0)
MCV: 89.2 fL (ref 80.0–100.0)
MONO ABS: 1 10*3/uL (ref 0.2–1.0)
Monocytes Relative: 8 %
NEUTROS PCT: 85 %
Neutro Abs: 10.9 10*3/uL — ABNORMAL HIGH (ref 1.4–6.5)
PLATELETS: 296 10*3/uL (ref 150–440)
RBC: 2.93 MIL/uL — AB (ref 4.40–5.90)
RDW: 13.7 % (ref 11.5–14.5)
WBC: 12.8 10*3/uL — AB (ref 3.8–10.6)

## 2015-03-11 LAB — LIPID PANEL
CHOL/HDL RATIO: 4.6 ratio
Cholesterol: 88 mg/dL (ref 0–200)
HDL: 19 mg/dL — ABNORMAL LOW (ref >40)
LDL CALC: 49 mg/dL (ref 0–99)
Triglycerides: 102 mg/dL (ref <150)
VLDL: 20 mg/dL (ref 0–40)

## 2015-03-11 LAB — COMPREHENSIVE METABOLIC PANEL
ALBUMIN: 2.9 g/dL — AB (ref 3.5–5.0)
ALK PHOS: 65 U/L (ref 38–126)
ALT: 16 U/L — ABNORMAL LOW (ref 17–63)
ANION GAP: 7 (ref 5–15)
AST: 44 U/L — ABNORMAL HIGH (ref 15–41)
BUN: 19 mg/dL (ref 6–20)
CHLORIDE: 104 mmol/L (ref 101–111)
CO2: 28 mmol/L (ref 22–32)
Calcium: 8.4 mg/dL — ABNORMAL LOW (ref 8.9–10.3)
Creatinine, Ser: 1.36 mg/dL — ABNORMAL HIGH (ref 0.61–1.24)
GFR calc Af Amer: 53 mL/min — ABNORMAL LOW (ref >60)
GFR calc non Af Amer: 46 mL/min — ABNORMAL LOW (ref >60)
GLUCOSE: 98 mg/dL (ref 65–99)
Potassium: 3.4 mmol/L — ABNORMAL LOW (ref 3.5–5.1)
SODIUM: 139 mmol/L (ref 135–145)
TOTAL PROTEIN: 5.8 g/dL — AB (ref 6.5–8.1)
Total Bilirubin: 0.8 mg/dL (ref 0.3–1.2)

## 2015-03-11 LAB — LIPASE, BLOOD: LIPASE: 166 U/L — AB (ref 11–51)

## 2015-03-14 DIAGNOSIS — K859 Acute pancreatitis without necrosis or infection, unspecified: Secondary | ICD-10-CM | POA: Diagnosis not present

## 2015-03-14 LAB — COMPREHENSIVE METABOLIC PANEL
ALK PHOS: 63 U/L (ref 38–126)
ALT: 18 U/L (ref 17–63)
AST: 43 U/L — ABNORMAL HIGH (ref 15–41)
Albumin: 3.1 g/dL — ABNORMAL LOW (ref 3.5–5.0)
Anion gap: 9 (ref 5–15)
BUN: 17 mg/dL (ref 6–20)
CALCIUM: 8.5 mg/dL — AB (ref 8.9–10.3)
CO2: 27 mmol/L (ref 22–32)
CREATININE: 1.44 mg/dL — AB (ref 0.61–1.24)
Chloride: 105 mmol/L (ref 101–111)
GFR, EST AFRICAN AMERICAN: 50 mL/min — AB (ref >60)
GFR, EST NON AFRICAN AMERICAN: 43 mL/min — AB (ref >60)
Glucose, Bld: 102 mg/dL — ABNORMAL HIGH (ref 65–99)
Potassium: 3.5 mmol/L (ref 3.5–5.1)
Sodium: 141 mmol/L (ref 135–145)
Total Bilirubin: 0.8 mg/dL (ref 0.3–1.2)
Total Protein: 6 g/dL — ABNORMAL LOW (ref 6.5–8.1)

## 2015-03-14 LAB — CBC WITH DIFFERENTIAL/PLATELET
BASOS PCT: 0 %
Basophils Absolute: 0 10*3/uL (ref 0–0.1)
EOS ABS: 0.1 10*3/uL (ref 0–0.7)
EOS PCT: 1 %
HEMATOCRIT: 28.8 % — AB (ref 40.0–52.0)
HEMOGLOBIN: 9.7 g/dL — AB (ref 13.0–18.0)
LYMPHS PCT: 12 %
Lymphs Abs: 1.2 10*3/uL (ref 1.0–3.6)
MCH: 30.3 pg (ref 26.0–34.0)
MCHC: 33.7 g/dL (ref 32.0–36.0)
MCV: 89.8 fL (ref 80.0–100.0)
MONOS PCT: 9 %
Monocytes Absolute: 0.9 10*3/uL (ref 0.2–1.0)
NEUTROS ABS: 8.1 10*3/uL — AB (ref 1.4–6.5)
Neutrophils Relative %: 78 %
Platelets: 309 10*3/uL (ref 150–440)
RBC: 3.2 MIL/uL — ABNORMAL LOW (ref 4.40–5.90)
RDW: 13.7 % (ref 11.5–14.5)
WBC: 10.3 10*3/uL (ref 3.8–10.6)

## 2015-03-14 LAB — LIPASE, BLOOD: Lipase: 294 U/L — ABNORMAL HIGH (ref 11–51)

## 2016-07-23 ENCOUNTER — Emergency Department
Admission: EM | Admit: 2016-07-23 | Discharge: 2016-07-25 | Disposition: A | Discharge status: Other Acute Inpatient Hospital | Payer: Medicare Other | Attending: Emergency Medicine | Admitting: Emergency Medicine

## 2016-07-23 ENCOUNTER — Emergency Department: Payer: Medicare Other

## 2016-07-23 ENCOUNTER — Encounter: Payer: Self-pay | Admitting: Emergency Medicine

## 2016-07-23 DIAGNOSIS — F419 Anxiety disorder, unspecified: Secondary | ICD-10-CM | POA: Diagnosis not present

## 2016-07-23 DIAGNOSIS — R4182 Altered mental status, unspecified: Secondary | ICD-10-CM | POA: Diagnosis present

## 2016-07-23 DIAGNOSIS — F0391 Unspecified dementia with behavioral disturbance: Secondary | ICD-10-CM | POA: Diagnosis not present

## 2016-07-23 LAB — CBC WITH DIFFERENTIAL/PLATELET
BASOS PCT: 0 %
Basophils Absolute: 0 10*3/uL (ref 0–0.1)
EOS ABS: 0.1 10*3/uL (ref 0–0.7)
Eosinophils Relative: 1 %
HCT: 44.5 % (ref 40.0–52.0)
HEMOGLOBIN: 14.8 g/dL (ref 13.0–18.0)
Lymphocytes Relative: 12 %
Lymphs Abs: 1.2 10*3/uL (ref 1.0–3.6)
MCH: 30.2 pg (ref 26.0–34.0)
MCHC: 33.2 g/dL (ref 32.0–36.0)
MCV: 91.1 fL (ref 80.0–100.0)
MONOS PCT: 6 %
Monocytes Absolute: 0.6 10*3/uL (ref 0.2–1.0)
NEUTROS PCT: 81 %
Neutro Abs: 8.1 10*3/uL — ABNORMAL HIGH (ref 1.4–6.5)
Platelets: 226 10*3/uL (ref 150–440)
RBC: 4.89 MIL/uL (ref 4.40–5.90)
RDW: 13.1 % (ref 11.5–14.5)
WBC: 9.9 10*3/uL (ref 3.8–10.6)

## 2016-07-23 LAB — COMPREHENSIVE METABOLIC PANEL
ALT: 11 U/L — ABNORMAL LOW (ref 17–63)
ANION GAP: 7 (ref 5–15)
AST: 29 U/L (ref 15–41)
Albumin: 4.8 g/dL (ref 3.5–5.0)
Alkaline Phosphatase: 95 U/L (ref 38–126)
BILIRUBIN TOTAL: 0.9 mg/dL (ref 0.3–1.2)
BUN: 24 mg/dL — AB (ref 6–20)
CO2: 28 mmol/L (ref 22–32)
Calcium: 9.8 mg/dL (ref 8.9–10.3)
Chloride: 103 mmol/L (ref 101–111)
Creatinine, Ser: 1.4 mg/dL — ABNORMAL HIGH (ref 0.61–1.24)
GFR, EST AFRICAN AMERICAN: 51 mL/min — AB (ref >60)
GFR, EST NON AFRICAN AMERICAN: 44 mL/min — AB (ref >60)
Glucose, Bld: 108 mg/dL — ABNORMAL HIGH (ref 65–99)
POTASSIUM: 4.7 mmol/L (ref 3.5–5.1)
Sodium: 138 mmol/L (ref 135–145)
TOTAL PROTEIN: 7.4 g/dL (ref 6.5–8.1)

## 2016-07-23 LAB — ACETAMINOPHEN LEVEL

## 2016-07-23 LAB — ETHANOL: Alcohol, Ethyl (B): <5 mg/dL (ref <5)

## 2016-07-23 LAB — SALICYLATE LEVEL
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
## 2016-07-23 NOTE — ED Notes (Signed)
Pt to CT via stretcher with ODS officer and CT tech.

## 2016-07-23 NOTE — ED Notes (Signed)
ENVIRONMENTAL ASSESSMENT  Potentially harmful objects out of patient reach: Yes.  Personal belongings secured: Yes.  Patient dressed in hospital provided attire only: Yes.  Plastic bags out of patient reach: Yes.  Patient care equipment (cords, cables, call bells, lines, and drains) shortened, removed, or accounted for: Yes.  Equipment and supplies removed from bottom of stretcher: Yes.  Potentially toxic materials out of patient reach: Yes.  Sharps container removed or out of patient reach: Yes.   BEHAVIORAL HEALTH ROUNDING  Patient sleeping: No.  Patient alert and oriented: alert, oriented to self and place only.  Behavior appropriate: Yes. ; If no, describe:  Nutrition and fluids offered: Yes  Toileting and hygiene offered: Yes  Sitter present: not applicable, Q 15 min safety rounds and observation.  Law enforcement present: Yes ODS

## 2016-07-23 NOTE — ED Notes (Signed)
BEHAVIORAL HEALTH ROUNDING  Patient sleeping: No.  Patient alert and oriented: yes to his baseline Behavior appropriate: Yes. ; If no, describe:  Nutrition and fluids offered: Yes  Toileting and hygiene offered: Yes  Sitter present: not applicable, Q 15 min safety rounds and observation.  Law enforcement present: Yes ODS  
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
## 2016-07-23 NOTE — ED Notes (Signed)
Pt returned from CT °

## 2016-07-24 ENCOUNTER — Emergency Department: Payer: Medicare Other

## 2016-07-24 DIAGNOSIS — F0391 Unspecified dementia with behavioral disturbance: Secondary | ICD-10-CM | POA: Diagnosis not present

## 2016-07-24 LAB — URINE DRUG SCREEN, QUALITATIVE (ARMC ONLY)
AMPHETAMINES, UR SCREEN: NOT DETECTED
BARBITURATES, UR SCREEN: NOT DETECTED
Benzodiazepine, Ur Scrn: NOT DETECTED
COCAINE METABOLITE, UR ~~LOC~~: NOT DETECTED
Cannabinoid 50 Ng, Ur ~~LOC~~: NOT DETECTED
MDMA (ECSTASY) UR SCREEN: NOT DETECTED
METHADONE SCREEN, URINE: NOT DETECTED
OPIATE, UR SCREEN: NOT DETECTED
Phencyclidine (PCP) Ur S: NOT DETECTED
TRICYCLIC, UR SCREEN: NOT DETECTED

## 2016-07-24 LAB — URINALYSIS, COMPLETE (UACMP) WITH MICROSCOPIC
BACTERIA UA: NONE SEEN
BILIRUBIN URINE: NEGATIVE
Glucose, UA: NEGATIVE mg/dL
Hgb urine dipstick: NEGATIVE
Ketones, ur: NEGATIVE mg/dL
Leukocytes, UA: NEGATIVE
NITRITE: NEGATIVE
PH: 7 (ref 5.0–8.0)
Protein, ur: NEGATIVE mg/dL
SPECIFIC GRAVITY, URINE: 1.009 (ref 1.005–1.030)
SQUAMOUS EPITHELIAL / LPF: NONE SEEN

## 2016-07-24 NOTE — ED Notes (Signed)
BEHAVIORAL HEALTH ROUNDING Patient sleeping: Yes.   Patient alert and oriented: not applicable SLEEPING Behavior appropriate: Yes.  ; If no, describe: SLEEPING Nutrition and fluids offered: No SLEEPING Toileting and hygiene offered: NoSLEEPING Sitter present: not applicable, Q 15 min safety rounds and observation. Law enforcement present: Yes ODS 

## 2016-07-24 NOTE — Progress Notes (Signed)
Writer [place] [facility] from [place] [facility] inquiring if urine drug screen could be faxed to them. Clinicals have been faxed.   Per Lily KocherJeffrey, [facility] is reviewing referral for possible admission.  Melbourne Abtsatia Aimee Timmons, LCSWA Disposition staff 07/24/2016 11:46 AM

## 2016-07-24 NOTE — ED Notes (Signed)
Pt given meal tray.

## 2016-07-24 NOTE — ED Notes (Signed)
Pt is resting in hallway. Very polite and cooperative.

## 2016-07-24 NOTE — BH Assessment (Signed)
Spoke with Lashonda from [facility] and she stated the patient is accepted by the Dr, but needs a chest X-ray and EKG.  Spoke with Dr. Brown about placing order for chest X-Ray and EKG. 

## 2016-07-24 NOTE — ED Notes (Signed)
Pt ambulated to bathroom unassisted.

## 2016-07-24 NOTE — ED Notes (Signed)
Pt up to bathroom with slow, steady gait. Urine specimen obtained.

## 2016-07-24 NOTE — ED Notes (Signed)
Pt sleeping at this time.

## 2016-07-24 NOTE — ED Notes (Signed)
Pt sitting up in bed eating supper.

## 2016-07-24 NOTE — ED Notes (Signed)
Meal tray at bedside.  

## 2016-07-24 NOTE — ED Notes (Signed)
Pt showered

## 2016-07-24 NOTE — ED Notes (Signed)
BEHAVIORAL HEALTH ROUNDING  Patient sleeping: No.  Patient alert and oriented: yes  Behavior appropriate: Yes. ; If no, describe:  Nutrition and fluids offered: Yes  Toileting and hygiene offered: Yes  Sitter present: not applicable, Q 15 min safety rounds and observation.  Law enforcement present: Yes ODS  

## 2016-07-24 NOTE — BH Assessment (Signed)
EKG and Chest X-ray submitted to [facility]. 

## 2016-07-24 NOTE — ED Notes (Signed)
Patients wife called to check on patients status, pt gave verbal permission to speak to wife. Wife informed that [facility] is being [facility] and we are waiting on a few other things to be faxed to them and hopefully pt will be transferred today. Pt wife given phone call hours and she will call back to speak with the patient.

## 2016-07-24 NOTE — ED Notes (Signed)
Randi from APS called and informed nurse that [employer] are in the process of petitioning for guardianship of patient.  Donnal DebarRandi would like to be informed when patient is transferred [facility]  Cell phone: [phone]

## 2016-07-24 NOTE — BH Assessment (Signed)
Referral information for Psychiatric Hospitalization faxed to;    [facility] ([phone])),    [facility] ([phone]),    [facility] ([phone]),     [facility] ([phone]),    [facility] ([phone]),  

## 2016-07-24 NOTE — BH Assessment (Signed)
[facility] been [facility].  Accepting physician is Dr. Edger HouseJagan Subedi.  Call report to [phone].  Representative was [employer]issa.  ER Staff is aware of it Salley Scarlet(Nitchia, ER Sect.; Dr. Edger HouseJagan Subedi, Fanny BienQuale, ER MD & , Marylene LandAngela, Patient's Nurse)    Per chart, pt's wife has been informed by Marylene LandAngela, RN.

## 2016-07-24 NOTE — ED Notes (Signed)
Pt returned to room from xray.

## 2016-07-24 NOTE — ED Notes (Signed)
RN called TTS worker to check on the status of CXR and EKG results being faxed to [facility]. TTS worker stated that she would come make a copy of EKG and fax EKG and CXR results to [facility] so patient can be transferred.

## 2016-07-25 NOTE — ED Notes (Signed)
Transport is here to get pt. Pt given breakfast tray and informed that his ride was here to get him and to go ahead and eat first. Pt is sitting up in bed eating breakfast.

## 2016-07-25 NOTE — ED Notes (Signed)
BEHAVIORAL HEALTH ROUNDING Patient sleeping: Yes.   Patient alert and oriented: not applicable SLEEPING Behavior appropriate: Yes.  ; If no, describe: SLEEPING Nutrition and fluids offered: No SLEEPING Toileting and hygiene offered: NoSLEEPING Sitter present: not applicable, Q 15 min safety rounds and observation. Law enforcement present: Yes ODS 

## 2016-07-25 NOTE — ED Notes (Signed)

## 2016-07-25 NOTE — ED Provider Notes (Signed)
-----------------------------------------   6:35 AM on 07/25/2016 -----------------------------------------   Blood pressure (!) 119/20, pulse (!) 59, temperature 97.7 F (36.5 C), temperature source Oral, resp. rate 16, height 1.854 m (6\' 1" ), weight 81.6 kg (180 lb), SpO2 96 %.  Calm and cooperative at this time.  As per TTS note:  "[facility] been [facility].  Accepting physician is Dr. Edger HouseJagan Subedi.  Call report to [phone].  Representative was [employer]Melissa.  ER Staff is aware of it Salley Scarlet(Nitchia, ER Sect.; Dr. Edger HouseJagan Subedi, Fanny BienQuale, ER MD & , Marylene LandAngela, Patient's Nurse)    Per chart, pt's wife has been informed by Marylene LandAngela, RN."   Intolerant documentation has been completed, but physician reassessment will need to be repeated prior to transport.        Loleta RoseForbach, [place], MD 07/25/16 [phone]

## 2016-07-25 NOTE — ED Notes (Signed)
Pt belongings bag 1 of 1 [facility] and placed at quad RN desk for transfer.

## 2016-08-05 ENCOUNTER — Emergency Department
Admission: EM | Admit: 2016-08-05 | Discharge: 2016-08-06 | Disposition: A | Discharge status: Other Acute Inpatient Hospital | Payer: Medicare Other | Attending: Emergency Medicine | Admitting: Emergency Medicine

## 2016-08-05 ENCOUNTER — Encounter: Payer: Self-pay | Admitting: Emergency Medicine

## 2016-08-05 DIAGNOSIS — F0281 Dementia in other diseases classified elsewhere with behavioral disturbance: Secondary | ICD-10-CM

## 2016-08-05 DIAGNOSIS — F419 Anxiety disorder, unspecified: Secondary | ICD-10-CM | POA: Diagnosis not present

## 2016-08-05 DIAGNOSIS — Z79899 Other long term (current) drug therapy: Secondary | ICD-10-CM | POA: Insufficient documentation

## 2016-08-05 DIAGNOSIS — G309 Alzheimer's disease, unspecified: Secondary | ICD-10-CM | POA: Diagnosis not present

## 2016-08-05 DIAGNOSIS — F03918 Unspecified dementia, unspecified severity, with other behavioral disturbance: Secondary | ICD-10-CM

## 2016-08-05 DIAGNOSIS — F0391 Unspecified dementia with behavioral disturbance: Secondary | ICD-10-CM | POA: Insufficient documentation

## 2016-08-05 DIAGNOSIS — R451 Restlessness and agitation: Secondary | ICD-10-CM | POA: Diagnosis not present

## 2016-08-05 LAB — URINE DRUG SCREEN, QUALITATIVE (ARMC ONLY)
Amphetamines, Ur Screen: NOT DETECTED
BARBITURATES, UR SCREEN: NOT DETECTED
BENZODIAZEPINE, UR SCRN: NOT DETECTED
Cannabinoid 50 Ng, Ur ~~LOC~~: NOT DETECTED
Cocaine Metabolite,Ur ~~LOC~~: NOT DETECTED
MDMA (Ecstasy)Ur Screen: NOT DETECTED
METHADONE SCREEN, URINE: NOT DETECTED
OPIATE, UR SCREEN: NOT DETECTED
PHENCYCLIDINE (PCP) UR S: NOT DETECTED
Tricyclic, Ur Screen: NOT DETECTED

## 2016-08-05 LAB — COMPREHENSIVE METABOLIC PANEL
ALK PHOS: 96 U/L (ref 38–126)
ALT: 10 U/L — AB (ref 17–63)
AST: 28 U/L (ref 15–41)
Albumin: 4.1 g/dL (ref 3.5–5.0)
Anion gap: 9 (ref 5–15)
BUN: 27 mg/dL — AB (ref 6–20)
CALCIUM: 9.5 mg/dL (ref 8.9–10.3)
CHLORIDE: 104 mmol/L (ref 101–111)
CO2: 27 mmol/L (ref 22–32)
CREATININE: 1.45 mg/dL — AB (ref 0.61–1.24)
GFR calc Af Amer: 49 mL/min — ABNORMAL LOW (ref >60)
GFR, EST NON AFRICAN AMERICAN: 42 mL/min — AB (ref >60)
Glucose, Bld: 120 mg/dL — ABNORMAL HIGH (ref 65–99)
Potassium: 4.1 mmol/L (ref 3.5–5.1)
Sodium: 140 mmol/L (ref 135–145)
Total Bilirubin: 0.8 mg/dL (ref 0.3–1.2)
Total Protein: 6.8 g/dL (ref 6.5–8.1)

## 2016-08-05 LAB — CBC
HEMATOCRIT: 43.1 % (ref 40.0–52.0)
Hemoglobin: 14.5 g/dL (ref 13.0–18.0)
MCH: 30.8 pg (ref 26.0–34.0)
MCHC: 33.7 g/dL (ref 32.0–36.0)
MCV: 91.4 fL (ref 80.0–100.0)
Platelets: 195 10*3/uL (ref 150–440)
RBC: 4.71 MIL/uL (ref 4.40–5.90)
RDW: 13.2 % (ref 11.5–14.5)
WBC: 7.3 10*3/uL (ref 3.8–10.6)

## 2016-08-05 LAB — ETHANOL

## 2016-08-05 MED ORDER — QUETIAPINE FUMARATE 25 MG PO TABS
25.0000 mg | ORAL_TABLET | Freq: Two times a day (BID) | ORAL | Status: DC
  Administered 2016-08-05 – 2016-08-06 (×2): 25 mg via ORAL
  Filled 2016-08-05 (×2): qty 1
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
## 2016-08-05 NOTE — Progress Notes (Signed)
CSW received return phone call from APS regarding Patient's current disposition. Patient has been started on psychotropic medications and will remain in the ED while meds are being adjusted. TTS has made referrals to psych hospitals in the meantime. Per Harvie Heckandy with APS, Patient can return to ALF if medically and psychiatrically cleared. Patient's wife is at home, however, APS is also following wife with plans to obtain guardianship of her as well. Patient's son, Kem KaysGreg Sykes ([phone]) is agreeable to plan. Tammy SoursGreg does agree that Patient should [facility] if medically and psychiatrically cleared and is not to return home to wife.    Enos FlingAshley Aide Wojnar, MSW, LCSW [facility] Clinical Social Worker [phone]

## 2016-08-05 NOTE — ED Notes (Signed)
Spoke with Joel Mcconnell from TTS, Jerilynn SomCalvin states [facility] and will initiate transfer of patient to that facility.

## 2016-08-05 NOTE — ED Notes (Signed)
Patient is asleep on the stretcher, rise and fall of chest noted; patient appears to be comfortable and does not appear to be in distress at this time. Sitter is present and rounding every 15 minutes to ensure safety. Law enforcement is present. No fluids or meals offered at this time. No toiletting offered at this time.  

## 2016-08-05 NOTE — Consult Note (Signed)
[facility] Consult   Reason for Consult:  Consult for 83 year old man brought to the hospital after breaking out of his assisted living facility Referring Physician:  Paduchowski Patient Identification: BLAKELY GLUTH MRN:  [id number] Principal Diagnosis: Dementia with behavioral problem Diagnosis:   Patient Active Problem List   Diagnosis Date Noted  . Dementia with behavioral problem [F03.91] 08/05/2016  . Hemoperitoneum [K66.1] 03/01/2015  . Abdominal hematoma [S30.1XXA]   . Abdominal pain [R10.9]   . ANXIETY [F41.1] 09/14/2006  . ERECTILE DYSFUNCTION [F52.8] 09/14/2006  . BENIGN POSITIONAL VERTIGO [H81.10] 09/14/2006  . ALLERGIC RHINITIS [J30.9] 09/14/2006  . SPONDYLOSIS [M47.9] 09/14/2006  . SPINAL STENOSIS [M48.00] 09/14/2006  . COLONIC POLYPS, HX OF [Z86.010] 09/14/2006    Total Time spent with patient: 1 hour  Subjective:   REINO LYBBERT is a 83 y.o. male patient admitted with "I just want to go home with my wife".  HPI:  Patient interviewed. Chart reviewed. This is an 83 year old man who was brought here after breaking out of his assisted living facility. Apparently he had been there for only 1 day. There came a time yesterday evening when they would not let him walk outside. He got agitated and kicked out a window and left the facility. Was picked up by police across the street. On interview today the patient admits to all of this but feels completely justified. He said he had no reason why he was at this place or why they would not let him leave. He tells me repeatedly that he just wants to go and live with his wife. Patient denies depression. Denies any specific physical symptoms. Totally denies suicidal or homicidal thoughts. Denies any hallucinations. He is clearly having pretty significant short-term memory impairment and a lot of trouble understanding his current situation. No evidence of substance abuse. I spoke with his wife and with his stepson who gave  me a more clear sense of the recent history. Patient had been in our emergency room a few weeks ago after some episodes of becoming more agitated at home in incidents that revolved around his demanding to be given the car keys even though the doctor recommends he not drive. He was sent to [facility] and reportedly had no behavior problems there and was discharged from [place] to this assisted living facility yesterday.  Social history: Patient is retired. Had been living with his wife locally until recently. Has 2 adult sons of his own who are not involved in the situation at all. His stepson, the son of his wife does seem to be making some effort to be involved and try to be of some help with sorting this out.  Medical history: Doesn't appear to have very many significant specific medical problems.  Substance abuse history: No known history of alcohol or drug abuse  Past Psychiatric History: Patient had been identified as having dementia by his primary care doctors in the past. It had been recommended that he not drive and he was having a very difficult time coping with that. Only one psychiatric hospitalization is known the recent one at [facility]. No other known psychiatric history besides dementia. The stepson clarifies that the patient was never actually violent with his wife although he was very demanding and forceful about being given his car keys in a way that felt threatening. No history of suicide attempts.  Risk to Self: Is patient at risk for suicide?: No Risk to Others:   Prior Inpatient Therapy:   Prior Outpatient Therapy:  Past Medical History: History reviewed. No pertinent past medical history. History reviewed. No pertinent surgical history. Family History: No family history on file. Family Psychiatric  History: Unknown Social History:  History  Alcohol Use No     History  Drug Use No    Social History   Social History  . Marital status: Married    Spouse name:  N/A  . Number of children: N/A  . Years of education: N/A   Social History Main Topics  . Smoking status: Never Smoker  . Smokeless tobacco: Never Used  . Alcohol use No  . Drug use: No  . Sexual activity: Not Asked   Other Topics Concern  . None   Social History Narrative  . None   Additional Social History:    Allergies:  No Known Allergies  Labs:  Results for orders placed or performed during the hospital encounter of 08/05/16 (from the past 48 hour(s))  Comprehensive metabolic panel     Status: Abnormal   Collection Time: 08/05/16  9:28 AM  Result Value Ref Range   Sodium 140 135 - 145 mmol/L   Potassium 4.1 3.5 - 5.1 mmol/L   Chloride 104 101 - 111 mmol/L   CO2 27 22 - 32 mmol/L   Glucose, Bld 120 (H) 65 - 99 mg/dL   BUN 27 (H) 6 - 20 mg/dL   Creatinine, Ser 1.45 (H) 0.61 - 1.24 mg/dL   Calcium 9.5 8.9 - 10.3 mg/dL   Total Protein 6.8 6.5 - 8.1 g/dL   Albumin 4.1 3.5 - 5.0 g/dL   AST 28 15 - 41 U/L   ALT 10 (L) 17 - 63 U/L   Alkaline Phosphatase 96 38 - 126 U/L   Total Bilirubin 0.8 0.3 - 1.2 mg/dL   GFR calc non Af Amer 42 (L) >60 mL/min   GFR calc Af Amer 49 (L) >60 mL/min    Comment: (NOTE) The eGFR has been calculated using the CKD EPI equation. This calculation has not been validated in all clinical situations. eGFR's persistently <60 mL/min signify possible Chronic Kidney Disease.    Anion gap 9 5 - 15  Ethanol     Status: None   Collection Time: 08/05/16  9:28 AM  Result Value Ref Range   Alcohol, Ethyl (B) <5 <5 mg/dL    Comment:        LOWEST DETECTABLE LIMIT FOR SERUM ALCOHOL IS 5 mg/dL FOR MEDICAL PURPOSES ONLY   cbc     Status: None   Collection Time: 08/05/16  9:28 AM  Result Value Ref Range   WBC 7.3 3.8 - 10.6 K/uL   RBC 4.71 4.40 - 5.90 MIL/uL   Hemoglobin 14.5 13.0 - 18.0 g/dL   HCT 43.1 40.0 - 52.0 %   MCV 91.4 80.0 - 100.0 fL   MCH 30.8 26.0 - 34.0 pg   MCHC 33.7 32.0 - 36.0 g/dL   RDW 13.2 11.5 - 14.5 %   Platelets 195 150  - 440 K/uL  Urine Drug Screen, Qualitative     Status: None   Collection Time: 08/05/16  9:29 AM  Result Value Ref Range   Tricyclic, Ur Screen NONE DETECTED NONE DETECTED   Amphetamines, Ur Screen NONE DETECTED NONE DETECTED   MDMA (Ecstasy)Ur Screen NONE DETECTED NONE DETECTED   Cocaine Metabolite,Ur [place] NONE DETECTED NONE DETECTED   Opiate, Ur Screen NONE DETECTED NONE DETECTED   Phencyclidine (PCP) Ur S NONE DETECTED NONE DETECTED   Cannabinoid 50  Ng, Ur [place] NONE DETECTED NONE DETECTED   Barbiturates, Ur Screen NONE DETECTED NONE DETECTED   Benzodiazepine, Ur Scrn NONE DETECTED NONE DETECTED   Methadone Scn, Ur NONE DETECTED NONE DETECTED    Comment: (NOTE) 967  Tricyclics, urine               Cutoff 1000 ng/mL 200  Amphetamines, urine             Cutoff 1000 ng/mL 300  MDMA (Ecstasy), urine           Cutoff 500 ng/mL 400  Cocaine Metabolite, urine       Cutoff 300 ng/mL 500  Opiate, urine                   Cutoff 300 ng/mL 600  Phencyclidine (PCP), urine      Cutoff 25 ng/mL 700  Cannabinoid, urine              Cutoff 50 ng/mL 800  Barbiturates, urine             Cutoff 200 ng/mL 900  Benzodiazepine, urine           Cutoff 200 ng/mL 1000 Methadone, urine                Cutoff 300 ng/mL 1100 1200 The urine drug screen provides only a preliminary, unconfirmed 1300 analytical test result and should not be used for non-medical 1400 purposes. Clinical consideration and professional judgment should 1500 be applied to any positive drug screen result due to possible 1600 interfering substances. A more specific alternate chemical method 1700 must be used in order to obtain a confirmed analytical result.  1800 Gas chromato graphy / mass spectrometry (GC/MS) is the preferred 1900 confirmatory method.     No current facility-administered medications for this encounter.    Current Outpatient Prescriptions  Medication Sig Dispense Refill  . cetirizine (ZYRTEC) 10 MG tablet Take 10 mg  by mouth daily as needed. For anxiety.    . donepezil (ARICEPT) 5 MG tablet Take 5 mg by mouth at bedtime.      Musculoskeletal: Strength & Muscle Tone: within normal limits Gait & Station: normal Patient leans: N/A  Psychiatric Specialty Exam: Physical Exam  Nursing note and vitals reviewed. Constitutional: He appears well-developed and well-nourished.  HENT:  Head: Normocephalic and atraumatic.  Eyes: Pupils are equal, round, and reactive to light. Conjunctivae are normal.  Neck: Normal range of motion.  Cardiovascular: Regular rhythm and normal heart sounds.   Respiratory: Effort normal. No respiratory distress.  GI: Soft.  Musculoskeletal: Normal range of motion.  Neurological: He is alert.  Skin: Skin is warm and dry.  Psychiatric: His affect is blunt. His speech is delayed and tangential. He is agitated. Thought content is not paranoid. Cognition and memory are impaired. He expresses impulsivity and inappropriate judgment. He expresses no homicidal and no suicidal ideation. He exhibits abnormal recent memory and abnormal remote memory.    Review of Systems  Constitutional: Negative.   HENT: Negative.   Eyes: Negative.   Respiratory: Negative.   Cardiovascular: Negative.   Gastrointestinal: Negative.   Musculoskeletal: Negative.   Skin: Negative.   Neurological: Negative.   Psychiatric/Behavioral: Positive for memory loss. Negative for depression, hallucinations, substance abuse and suicidal ideas. The patient is nervous/anxious. The patient does not have insomnia.     Blood pressure (!) 142/66, pulse 93, temperature 98.6 F (37 C), temperature source Oral, resp. rate 18, height 6' 1"  (  1.854 m), weight 81.6 kg (180 lb), SpO2 97 %.Body mass index is 23.75 kg/m.  General Appearance: Casual  Eye Contact:  Fair  Speech:  Slow  Volume:  Decreased  Mood:  Anxious  Affect:  Constricted  Thought Process:  Disorganized  Orientation:  Other:  He knew he was in a hospital but  he was not oriented beyond that certainly did not know the year or date  Thought Content:  Rumination and Tangential  Suicidal Thoughts:  No  Homicidal Thoughts:  No  Memory:  Immediate;   Fair Recent;   Poor Remote;   Poor  Judgement:  Impaired  Insight:  Lacking  Psychomotor Activity:  Decreased  Concentration:  Concentration: Poor  Recall:  Poor  Fund of Knowledge:  Fair  Language:  Fair  Akathisia:  No  Handed:  Right  AIMS (if indicated):     Assets:  Housing Social Support  ADL's:  Impaired  Cognition:  Impaired,  Mild and Moderate  Sleep:        Treatment Plan Summary: Medication management and Plan Patient with a significant degree of dementia that is impairing his ability to really understand the situation he is in. He is insisting that he wants to go back and live with his wife. That appears to be off the table at the moment. Adult Protective Services is trying to get guardianship of him and in the meanwhile the wife and stepson do not feel that it is safe for him to return home. The living facility is willing to give him another try if we can give him some medication that might help to calm things down. I will try starting some modest doses of Seroquel. Not sure if that will prevent behavior problems in the future. Patient is not really amenable to any psychoeducation right now because of his dementia. Case reviewed with emergency room physician and TTS. I don't think he is likely to benefit from repeat psychiatric hospitalization at this point and I doubt that [facility] would take him back.  Disposition: Supportive therapy provided about ongoing stressors. Discussed crisis plan, support from social network, calling 911, coming to the Emergency Department, and calling Suicide Hotline.  Alethia Berthold, MD 08/05/2016 12:36 PM

## 2016-08-05 NOTE — BH Assessment (Signed)
Referral information for Psychiatric Hospitalization faxed to;    [facility] [phone]),    [facility] [phone]),    [facility] [phone]),    [facility] [phone])   [facility] [phone]),    [facility] [phone]    [facility] [phone]),    [facility] [phone])   [facility] [phone]).   [facility]   [facility]   [facility]

## 2016-08-05 NOTE — ED Provider Notes (Signed)
[facility] Emergency Department Provider Note  Time seen: 11:27 AM  I have reviewed the triage vital signs and the nursing notes.   HISTORY  Chief Complaint IVC    HPI Joel Mcconnell is a 83 y.o. male with a past medical history of dementia, who presents to the emergency department for agitation under IVC. According to the IVC the patient was placed into a new group home last night. He has been anxious and agitated since his arrival. Today he attempted to kick out a window of his room in an attempt to leave. EMS was called and refused evaluation. They placed the patient under an IVC brought him to the ER for agitation with dementia. Patient is calm and cooperative in the emergency department. No distress with no complaints.  History reviewed. No pertinent past medical history.  Patient Active Problem List   Diagnosis Date Noted  . Hemoperitoneum 03/01/2015  . Abdominal hematoma   . Abdominal pain   . ANXIETY 09/14/2006  . ERECTILE DYSFUNCTION 09/14/2006  . BENIGN POSITIONAL VERTIGO 09/14/2006  . ALLERGIC RHINITIS 09/14/2006  . SPONDYLOSIS 09/14/2006  . SPINAL STENOSIS 09/14/2006  . COLONIC POLYPS, HX OF 09/14/2006    History reviewed. No pertinent surgical history.  Prior to Admission medications   Medication Sig Start Date End Date Taking? Authorizing Provider  cetirizine (ZYRTEC) 10 MG tablet Take 10 mg by mouth daily as needed. For anxiety. 03/24/09   [provider]  donepezil (ARICEPT) 5 MG tablet Take 5 mg by mouth at bedtime.    [provider]    No Known Allergies  No family history on file.  Social History Social History  Substance Use Topics  . Smoking status: Never Smoker  . Smokeless tobacco: Never Used  . Alcohol use No    Review of Systems Constitutional: Negative for fever. Cardiovascular: Negative for chest pain. Respiratory: Negative for shortness of breath. Gastrointestinal: Negative for abdominal  pain Genitourinary: Negative for dysuria. Neurological: Negative for headache All other ROS negative, although somewhat limited by history of dementia.  ____________________________________________   PHYSICAL EXAM:  VITAL SIGNS: ED Triage Vitals [08/05/16 0928]  Enc Vitals Group     BP (!) 142/66     Pulse Rate 93     Resp 18     Temp 98.6 F (37 C)     Temp Source Oral     SpO2 97 %     Weight 180 lb (81.6 kg)     Height 6\' 1"  (1.854 m)     Head Circumference      Peak Flow      Pain Score      Pain Loc      Pain Edu?      Excl. in GC?     Constitutional: Alert, no distress. Eyes: Normal exam ENT   Head: Normocephalic and atraumatic   Mouth/Throat: Mucous membranes are moist. Cardiovascular: Normal rate, regular rhythm Respiratory: Normal respiratory effort without tachypnea nor retractions. Breath sounds are clear  Gastrointestinal: Soft and nontender. No distention.   Musculoskeletal: Nontender with normal range of motion in all extremities.  Neurologic:  Normal speech and language. No gross focal neurologic deficits  Skin:  Skin is warm, dry and intact.  Psychiatric: Calm, cooperative, normal affect.  ____________________________________________   INITIAL IMPRESSION / ASSESSMENT AND PLAN / ED COURSE  Pertinent labs & imaging results that were available during my care of the patient were reviewed by me and considered in my  medical decision making (see chart for details).  Patient presents to the emergency department for agitation and dementia. Currently the patient is calm, cooperative, no distress. Physical exam shows no concerning findings. No abrasions or lacerations. Patient has no complaints at this time. We will maintain the IVC into the patient to be adequately evaluated by psychiatry.  Patient's medical workup has been largely nonrevealing, awaiting psychiatric disposition.  ____________________________________________   FINAL CLINICAL  IMPRESSION(S) / ED DIAGNOSES  Agitation    Minna Antis, MD 08/05/16 [phone]

## 2016-08-05 NOTE — ED Notes (Signed)
Pt floor is sticky from juice and pt stepped in it. Pt given new socks and all linens changed. [facility] called with no answer. Will keep trying.

## 2016-08-05 NOTE — BH Assessment (Signed)
Writer called and left a HIPPA Compliant message with son [phone]), requesting a return phone call.

## 2016-08-05 NOTE — BH Assessment (Signed)
Patient has been [facility].  [facility] Accepting physician is Dr.Rasul  Ija .  Call report to [phone].  Representative was [place].  ER Staff is aware of it Olegario Messier(Kathy, ER Sect.; Dr. Juliette AlcideMelinda, ER MD & Bernette RedbirdKenny, Patient's Nurse, )     Writer made multiple attempts to contact patient's family but was unable to reach anyone.

## 2016-08-05 NOTE — ED Notes (Signed)
Pt given supper tray.

## 2016-08-05 NOTE — BH Assessment (Signed)
Writer spoke with son Tammy Sours(Greg [phone]) updated him on the plan of patient remaining in the ER with medications adjustments, as well [facility].

## 2016-08-05 NOTE — BH Assessment (Signed)
Assessment Note  Joel Mcconnell is an 83 y.o. male who presents to the ER due to his new care home having concerns about his behaviors. Patient recently moved into the facility and on today he broke a window, with the intention of leaving. He states, he wanted to go home and live with his wife. Patient was recently inpatient with [facility] for Dementia with Behavioral Disturbance.  During the interview the patient was calm, cooperative and pleasant. He voiced his frustration about been in the ER. "I don't know why they brought me back up here (ER). They just did this. I wanted to be home with my wife; they brought me up here (ER) and sent me to some other place [facility] I told them I wanted to go home but they brought me here." Patient denies SI/HI and AV/H.  Diagnosis: Dementia with Behavioral Problem  Past Medical History: History reviewed. No pertinent past medical history.  History reviewed. No pertinent surgical history.  Family History: No family history on file.  Social History:  reports that he has never smoked. He has never used smokeless tobacco. He reports that he does not drink alcohol or use drugs.  Additional Social History:  Alcohol / Drug Use Pain Medications: See PTA Prescriptions: See PTA Over the Counter: See PTA History of alcohol / drug use?: No history of alcohol / drug abuse  CIWA: CIWA-Ar BP: (!) 142/66 Pulse Rate: 93 COWS:    Allergies: No Known Allergies  Home Medications:  (Not in a hospital admission)  OB/GYN Status:  No LMP for male patient.  General Assessment Data [facility] TTS Assessment: In system Is this a Tele or Face-to-Face Assessment?: Face-to-Face Is this an Initial Assessment or a Re-assessment for this encounter?: Initial Assessment Marital status: Married ChapmanMaiden name: n/a Is patient pregnant?: No Pregnancy Status: No Living Arrangements: Other (Comment) (Care Homes) Can pt return to  current living arrangement?: Yes Admission Status: Involuntary Is patient capable of signing voluntary admission?: No (Under IVC) Referral Source: Self/Family/Friend Insurance type: Medicare  Medical Screening Exam [facility] Walk-in ONLY) Medical Exam completed: Yes  Crisis Care Plan Living Arrangements: Other (Comment) (Care Homes) Legal Guardian: Other: (Self) Name of Psychiatrist: Reports of none Name of Therapist: Reports of none  Education Status Is patient currently in school?: No Current Grade: n/a Highest grade of school patient has completed: Unknown Name of school: Unknown Contact person: Unknown  Risk to self with the past 6 months Suicidal Ideation: No Has patient been a risk to self within the past 6 months prior to admission? : No Suicidal Intent: No Has patient had any suicidal intent within the past 6 months prior to admission? : No Is patient at risk for suicide?: No Suicidal Plan?: No Has patient had any suicidal plan within the past 6 months prior to admission? : No Access to Means: No What has been your use of drugs/alcohol within the last 12 months?: Reports of none Previous Attempts/Gestures: No How many times?: 0 Other Self Harm Risks: none Triggers for Past Attempts: None known Intentional Self Injurious Behavior: None Family Suicide History: No Recent stressful life event(s): Other (Comment) (Recently moved into Care Home) Persecutory voices/beliefs?: No Depression: No Depression Symptoms: Feeling angry/irritable Substance abuse history and/or treatment for substance abuse?: No Suicide prevention information given to non-admitted patients: Not applicable  Risk to Others within the past 6 months Homicidal Ideation: No Does patient have any lifetime risk of violence toward others beyond the  six months prior to admission? : No Thoughts of Harm to Others: No Current Homicidal Intent: No Current Homicidal Plan: No Access to Homicidal Means:  No Identified Victim: Reports none History of harm to others?: Yes Assessment of Violence: In distant past Violent Behavior Description: Broke glass at the care home Does patient have access to weapons?: No Criminal Charges Pending?: No Does patient have a court date: No Is patient on probation?: No  Psychosis Hallucinations: None noted Delusions: None noted  Mental Status Report Appearance/Hygiene: Unremarkable, In scrubs Eye Contact: Fair Motor Activity: Freedom of movement, Unremarkable Speech: Logical/coherent, Unremarkable Level of Consciousness: Alert Mood: Anxious, Sad, Irritable, Helpless Affect: Appropriate to circumstance, Depressed Anxiety Level: Minimal Thought Processes: Coherent, Relevant Judgement: Unimpaired Orientation: Person, Place, Time, Appropriate for developmental age Obsessive Compulsive Thoughts/Behaviors: Minimal  Cognitive Functioning Concentration: Normal Memory: Remote Intact, Recent Impaired IQ: Average Insight: Poor Impulse Control: Poor Appetite: Fair Weight Loss: 0 Weight Gain: 0 Sleep: No Change Total Hours of Sleep: 8 Vegetative Symptoms: None  ADLScreening [facility] Assessment Services) Patient's cognitive ability adequate to safely complete daily activities?: Yes Patient able to express need for assistance with ADLs?: Yes Independently performs ADLs?: Yes (appropriate for developmental age)  Prior Inpatient Therapy Prior Inpatient Therapy: Yes Prior Therapy Dates: 07/2016 Prior Therapy Facilty/Provider(s): [facility] Reason for Treatment: Dementia with behavioral problem  Prior Outpatient Therapy Prior Outpatient Therapy: No Prior Therapy Dates: Reports of none Prior Therapy Facilty/Provider(s): Reports of none Reason for Treatment: Reports of none Does patient have an ACCT team?: No Does patient have Intensive In-House Services?  : No Does patient have [facility] services? : No Does patient have P4CC services?:  No  ADL Screening (condition at time of admission) Patient's cognitive ability adequate to safely complete daily activities?: Yes Is the patient deaf or have difficulty hearing?: No Does the patient have difficulty seeing, even when wearing glasses/contacts?: No Does the patient have difficulty concentrating, remembering, or making decisions?: No Patient able to express need for assistance with ADLs?: Yes Does the patient have difficulty dressing or bathing?: No Independently performs ADLs?: Yes (appropriate for developmental age) Does the patient have difficulty walking or climbing stairs?: No Weakness of Legs: None Weakness of Arms/Hands: None  Home Assistive Devices/Equipment Home Assistive Devices/Equipment: None  Therapy Consults (therapy consults require a physician order) PT Evaluation Needed: No OT Evalulation Needed: No SLP Evaluation Needed: No Abuse/Neglect Assessment (Assessment to be complete while patient is alone) Physical Abuse: Denies Verbal Abuse: Denies Sexual Abuse: Denies Exploitation of patient/patient's resources: Denies Self-Neglect: Denies Values / Beliefs Cultural Requests During Hospitalization: None Spiritual Requests During Hospitalization: None Consults Spiritual Care Consult Needed: No Social Work Consult Needed: No Merchant navy officer (For Healthcare) Does Patient Have a Medical Advance Directive?: No    Additional Information 1:1 In Past 12 Months?: No CIRT Risk: No Elopement Risk: No Does patient have medical clearance?: Yes  Child/Adolescent Assessment Running Away Risk: Denies (Patient is an adult)  Disposition:  Disposition Initial Assessment Completed for this Encounter: Yes Disposition of Patient: Other dispositions (ER MD Ordered Psych Consult)  On Site Evaluation by:   Reviewed with Physician:    Lilyan Gilford MS, LCAS, LPC, NCC, CCSI Therapeutic Triage Specialist 08/05/2016 3:32 PM

## 2016-08-05 NOTE — ED Notes (Signed)
Pt given lunch tray.

## 2016-08-05 NOTE — ED Notes (Signed)
Pt is on phone with spouse and stating he just wants to be where she is.

## 2016-08-05 NOTE — ED Notes (Signed)
This RN spoke at length, approximately 25 mins, with the patient's family, (son Kem KaysGreg Sykes and pt's wife), and they expressed concern about patient's placement at [facility]; family was informed that we will contact them prior to having the patient moved to [facility]; next shift RN will be notified regarding this conversation with the family.

## 2016-08-05 NOTE — ED Triage Notes (Signed)
Pt brought in by [employer]. Pt was recently placed in [facility] and wife is placed somewhere [place]. Pt was reported to be combative and kicked out a window at [facility] which he wandered away from. [employer] [facility] pt up [facility] which is across the street. [employer] reports pt has been calm. Pt states he just wants to get back home to where his wife is. Pt calm and cooperative in triage. Pt changed into wine colored scrubs.

## 2016-08-05 NOTE — ED Notes (Signed)
Pt's son, Kem KaysGreg Sykes, called and asked to speak to the patient. This RN informed Mr Gerilyn PilgrimSykes that the patient was asleep at this time, and asked if he wanted to wake the patient up? Mr Gerilyn PilgrimSykes responded in the affirmative; the patient was woken up and given the phone to speak with Mr Gerilyn PilgrimSykes.

## 2016-08-05 NOTE — ED Notes (Signed)
IVC 

## 2016-08-05 NOTE — Consult Note (Signed)
  [facility] and can be transferred as soon as possible.

## 2016-08-05 NOTE — ED Notes (Signed)
Lynden Angathy, ER Secretary attempted to arrange for transportation for the patient; earliest transportation available is after 7 am tomorrow morning.

## 2016-08-06 DIAGNOSIS — G309 Alzheimer's disease, unspecified: Secondary | ICD-10-CM | POA: Diagnosis not present

## 2016-08-06 DIAGNOSIS — R451 Restlessness and agitation: Secondary | ICD-10-CM | POA: Diagnosis not present

## 2016-08-06 DIAGNOSIS — F0281 Dementia in other diseases classified elsewhere with behavioral disturbance: Secondary | ICD-10-CM | POA: Diagnosis not present

## 2016-08-06 MED ORDER — QUETIAPINE FUMARATE 25 MG PO TABS
25.0000 mg | ORAL_TABLET | Freq: Two times a day (BID) | ORAL | 1 refills | Status: DC
Start: 2016-08-06 — End: 2018-10-11

## 2016-08-06 NOTE — Clinical Social Work Note (Signed)
Pt is ready for discharge today and will return to [facility] as family has declined inpatient psych treatment. Facility is able to accept pt. CSW updated [facility] [place] worker. CSW is signing off as no further needs identified.   Dede QuerySarah Kally Cadden, MSW, LCSW  Clinical Social Worker  [phone]

## 2016-08-06 NOTE — ED Notes (Signed)
Patient given lunch tray.

## 2016-08-06 NOTE — BH Assessment (Signed)
Clinician received message to from ED secretary Misty Stanley(Lisa) to contact pt's son Tammy Sours(Greg [phone]). Clinician contacted son who requested update on pt disposition. Clinician informed son that she had spoken with mother. Clinician also informed son that she had verified with [facility] that pt may return once psychiatrically and medically cleared. Son reiterated family's objection to [facility] placement. Clinician informed son that Dr.Clapacs was aware and had previously stated that he would rescind IVC if pt could return to ALF. Clinician explained that she would contact son with update once Dr.Clapacs arrived to ED.

## 2016-08-06 NOTE — ED Notes (Signed)
Patient is asleep on the stretcher, rise and fall of chest noted; patient appears to be comfortable and does not appear to be in distress at this time. Sitter is present and rounding every 15 minutes to ensure safety. Law enforcement is present. No fluids or meals offered at this time. No toiletting offered at this time.  

## 2016-08-06 NOTE — ED Notes (Signed)
Patient sleeping

## 2016-08-06 NOTE — Consult Note (Signed)
  Patient seen. Medicines have been adjusted. Patient is currently stable to be taking care of back at a assisted living level. Family declined this suggestion of transfer to a geriatric psychiatry hospital.

## 2016-08-06 NOTE — BH Assessment (Signed)
Clinician informed [facility] Pricilla Holm(Tucker) that pt's bed was no longer needed.

## 2016-08-06 NOTE — BH Assessment (Addendum)
Clinician contacted Dr.Clapacs to inform him of family members concerns regarding [facility] placement. Per Dr.Clapcs, pt is recommended to be transferred to [facility] however, if pt objects and [facility]F is willing to accept pt, then IVC will be rescinded and pt may be discharged.  Clinician attempted to contact pt's son Kem Kays(Greg Sykes [phone]) who was asleep per pt's wife Sharyn Lull(Joyce Longfield). Clinician spoke with wife regarding family's concerns. Clinician explained to wife assessment and geropsych referral process. Clinician clarified with wife that [facility] is not a long term placement and that the typical length of inpatient stay is 3-7 days.   Wife voiced understanding of process. Wife states that she does not believe pt to be at risk for harming himself or anyone else. Wife states pt has no previous h/o aggression. Wife verified that, as noted in chart, [facility] is willing to accept pt upon medical and psychiatric clearance. Wife requesting pt be discharged so that family may transport him back to ALF.  Clinician will inform [facility] that no bed is needed once IVC is rescinded by Dr. Toni Amendlapacs.

## 2016-08-06 NOTE — ED Notes (Signed)
Patient sitting on side of bed eating breakfast

## 2016-08-06 NOTE — BH Assessment (Signed)
Per Chart:  CSW received phone call from Patient's APS worker, Harvie HeckRandy [phone]). Per Harvie Heckandy, APS is in the process of petitioning for guardianship and notes that the guardianship hearing is on August 8th. [facility]. Pt was reported to be combative and [facility] away from.   "[facility] is agreeable to take Patient back pending his medications are adjusted due to combativeness.   Per APS, Patient is not to return home with his wife. "  ----  Given above referenced note, pt's family is not allowed to pick pt up for transport. Clinician contacted [facility] Kendal Hymen(Bonnie [phone]) who states pt may return "as long as he is stable". [facility] states they will provided transportation upon discharge. Clinician will discuss with Dr.Clapacs and then contact family w/ update..Marland Kitchen

## 2016-08-06 NOTE — ED Notes (Signed)
Patient in bathroom

## 2016-08-06 NOTE — BH Assessment (Addendum)
Documentation by Dr.Clapacs addressing pt stability and updated recommendation submitted to [facility] (fax [phone]). [facility] requesting additional note documentation from Dr.Clapacs but, states they are en route to pick pt up. Kendal Hymen[facility]nie verified receipt. Pt's son Tammy Sours(Greg) contacted and informed pt will be discharged to [facility]. Maralyn SagoSarah, LCSW also informed.  -Dr.Clapacs currently with pt. Clinician will update him of [facility] request for additional documentation.

## 2016-08-06 NOTE — ED Provider Notes (Signed)
-----------------------------------------   7:16 AM on 08/06/2016 -----------------------------------------   BP 114/87 (BP Location: Left Arm)   Pulse 71   Temp 98 F (36.7 C) (Oral)   Resp 16   Ht 1.854 m (6\' 1" )   Wt 81.6 kg (180 lb)   SpO2 98%   BMI 23.75 kg/m   No acute events overnight. Vitals reviewed. Patient remains medically cleared.  Disposition is pending per Psychiatry/Behavioral Medicine team recommendations.    Jene EveryKinner, Mary-Anne Polizzi, MD 08/06/16 [phone]

## 2016-08-06 NOTE — BH Assessment (Signed)
Clinician informed by ED Secretary Rivka Barbara(Glenda) that [facility] Hymen(Bonnie) arrived to pick up pt and pt refused. Pt is now to transport to [facility] as in original disposition. [facility] informed that pt is still in need of bed and states bed is still available.   Clinician informed pt's son of refusal and pt's pending transport to [facility]. Family remains in disagreement with disposition. Son is requesting to speak with pt's RN, as well as Dr.Clapacs. Son provided with ED contact number. ED Secretary Rivka Barbara(Glenda) made aware of request to speak with Dr.Clapacs.   Per Secretary transport has been called for pt transfer to [facility].

## 2016-08-06 NOTE — ED Provider Notes (Addendum)
-----------------------------------------   4:44 PM on 08/06/2016 -----------------------------------------   Blood pressure 118/66, pulse 91, temperature 98 F (36.7 C), temperature source Oral, resp. rate 16, height 6\' 1"  (1.854 m), weight 81.6 kg (180 lb), SpO2 96 %.  Patient has been evaluated by psychiatry and cleared for discharge. IVC lifted by Dr. Toni Amendlapacs. Patient's labs have been reviewed with no acute findings. [facility]    Don PerkingVeronese, [place], MD 08/06/16 1644   _________________________ 4:52 PM on 08/06/2016 -----------------------------------------  Patient refusing to go back to SNF, will resume plan to transfer to [facility] per Dr. Toni Amendlapacs.     Don PerkingVeronese, [place], MD 08/06/16 [phone]
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
## 2016-08-06 NOTE — BH Assessment (Signed)
Clinician informed [facility] Efraim Kaufmann(Melissa) that transport had been called for pt. Clinician also informed representative that RN was experiencing difficulty reaching someone to call report. Melissa Designer, [facility]states [facility] RN is currently passing meds and [facility] RN is on hold on the other line.

## 2016-08-06 NOTE — ED Notes (Signed)
Pt's emtala reviewed

## 2016-08-06 NOTE — ED Notes (Signed)
Pt refusing [facility] to [facility] at this time.  Pt states that he wants to go home and be with his wife, and does not want to go to [facility] and live there.  Unable to educate patient that [facility] would be a step closer to getting him home.  Pt continues to refuse.  TTS, Dr Toni Amendlapacs, and Dr. Don PerkingVeronese notified.

## 2016-08-06 NOTE — ED Notes (Signed)
Per Margaretmary LombardFatima TTS intake pt still has bed at [facility]/Call placed to [facility] to initiate transport via [facility]/ ED [facility] to proceed to [facility]
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
## 2016-08-06 NOTE — ED Notes (Signed)
This RN has made numerous attempts to give report to [facility] [place].  Was told my RN in admissions named Melissa that nurses were giving meds at this time and could not take report at this time.  Was placed on hold at this time.  Explained that pt would be leaving this hospital shortly and that report needed to be given soon.  Melissa verbalized understanding, but stated that no one was available to take report at this time.

## 2016-08-06 NOTE — ED Notes (Signed)
Patient given breakfast tray.

## 2016-08-06 NOTE — ED Notes (Signed)
[facility] [place]. She reports that the patient must be under psychiatric observation for 2 days before he is able to return to facility. States, we may also send a note from the psychiatrist clearing patient to return. TTS made aware and states she will be looking out for Dr. Toni Amendlapacs note. Fax # for [facility] [phone]

## 2017-03-01 IMAGING — CT CT ABD-PELV W/O CM
1 of 2 series · 15 of 32 positions shown, 19 images · non-contrast
Comparison: None.

CLINICAL DATA: Abdominal pain for 3 days

EXAM:
CT ABDOMEN AND PELVIS WITHOUT CONTRAST
TECHNIQUE: Multidetector CT imaging of the abdomen and pelvis was performed
following the standard protocol without IV contrast.

[Series 2: routine abd pel without · axial · non-contrast · 0.79mm/px · z∈[-482,+24]mm · 15 of 111 slices shown, 19 images]
[im 5/111  soft-tissue]
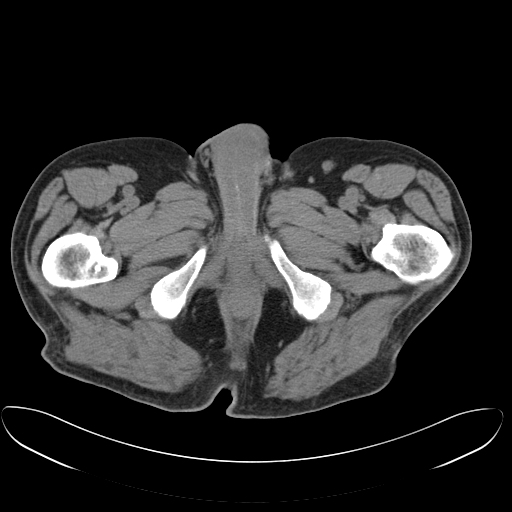
[im 5/111  bone]
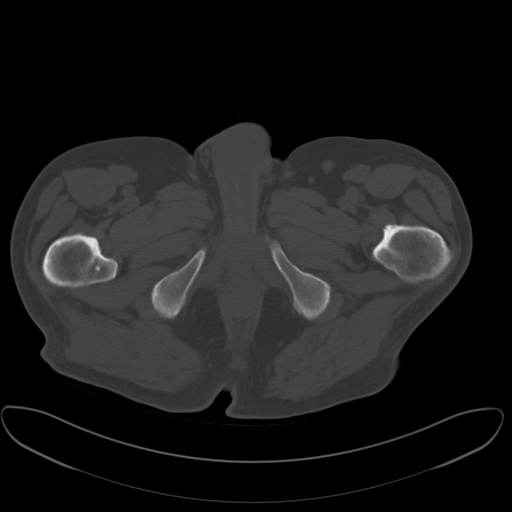
[im 14/111  soft-tissue]
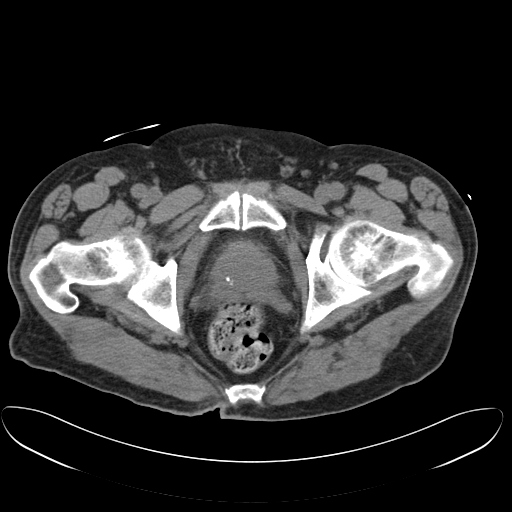
[im 23/111  soft-tissue]
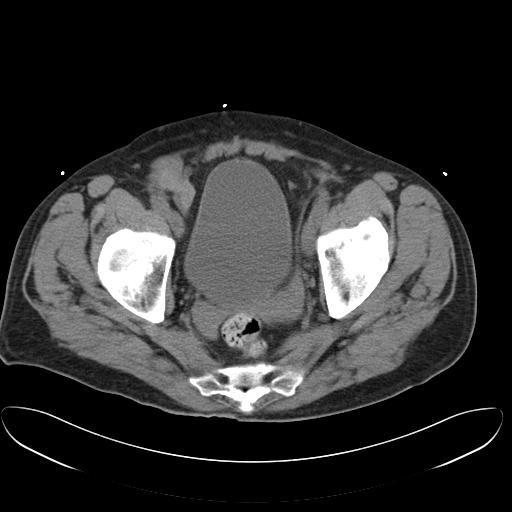
[im 31/111  soft-tissue]
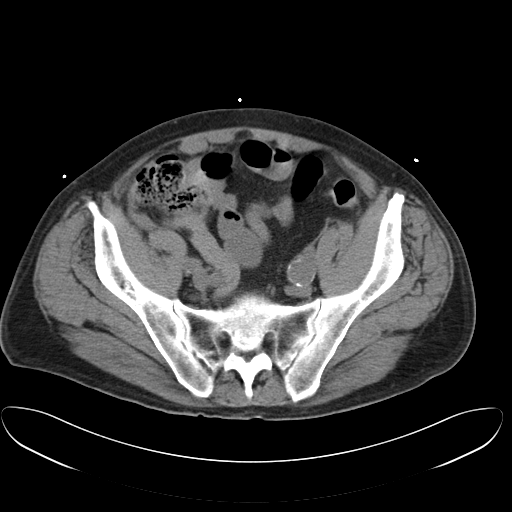
[im 40/111  soft-tissue]
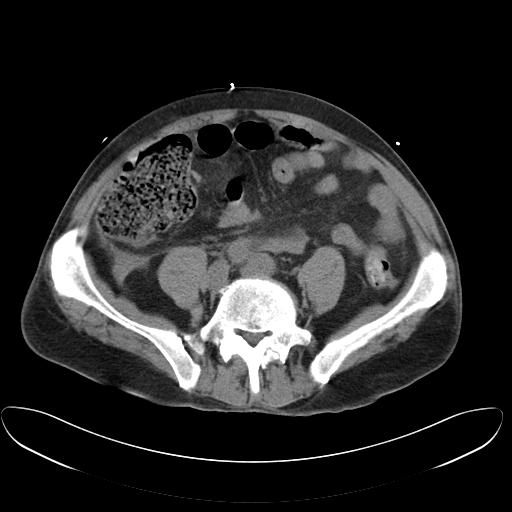
[im 49/111  soft-tissue]
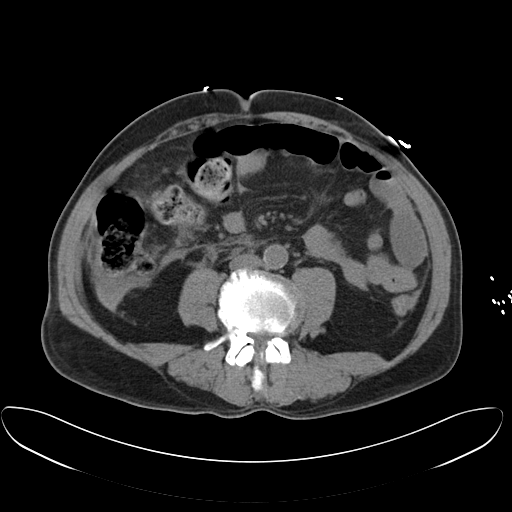
[im 58/111  soft-tissue]
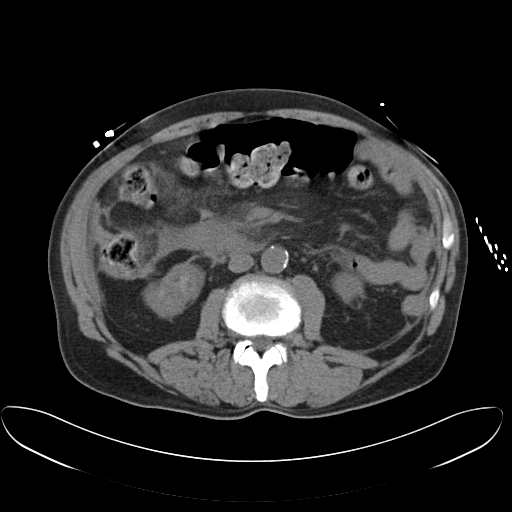
[im 62/111  soft-tissue]
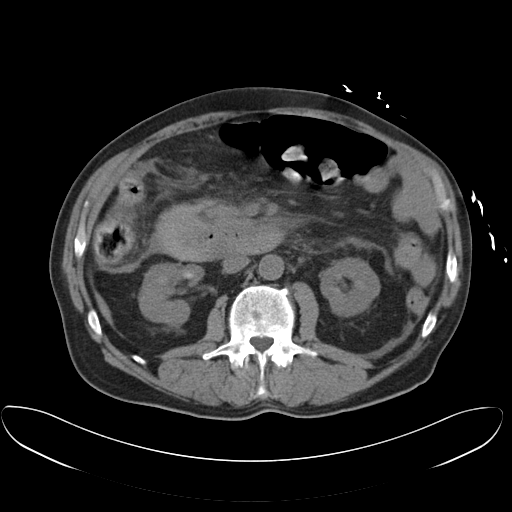
[im 71/111  soft-tissue]
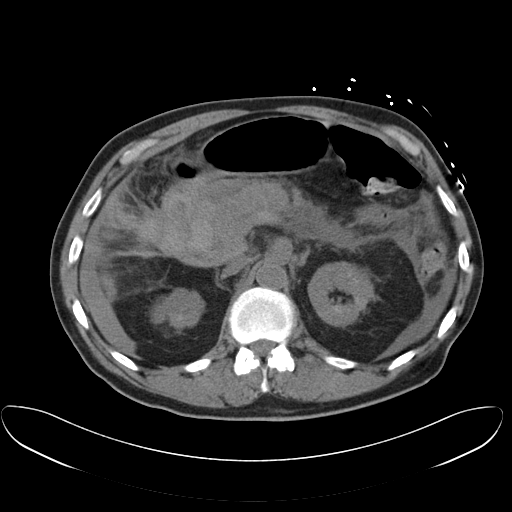
[im 71/111  bone]
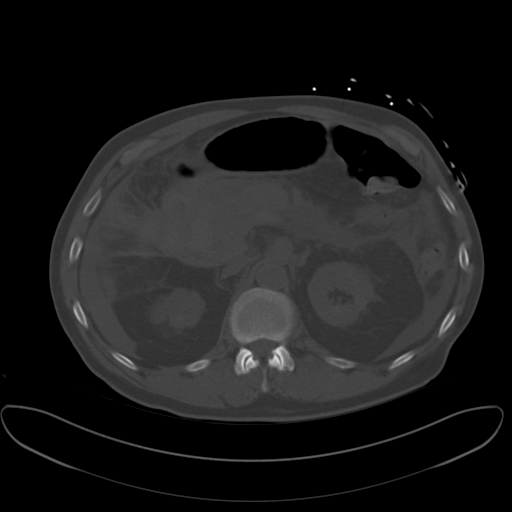
[im 80/111  soft-tissue]
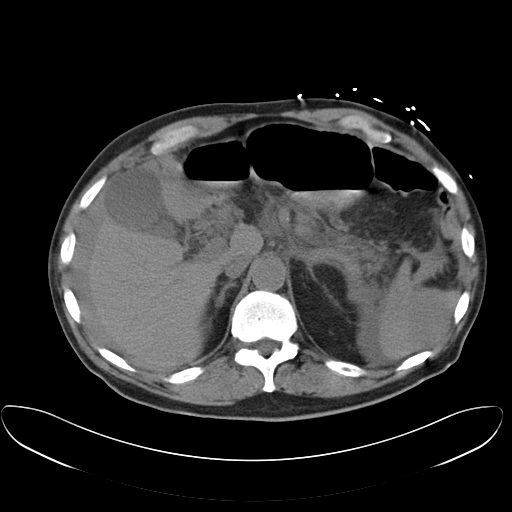
[im 89/111  soft-tissue]
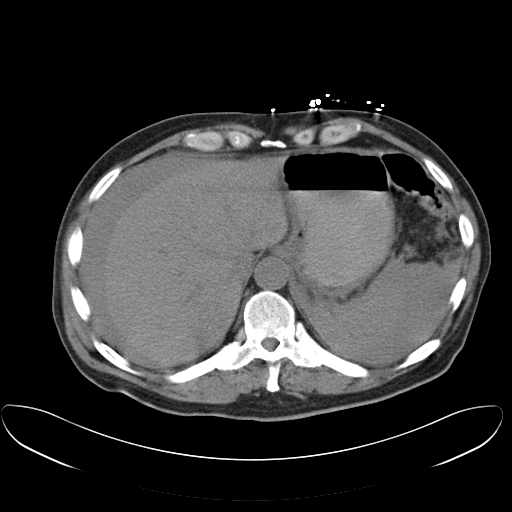
[im 93/111  lung]
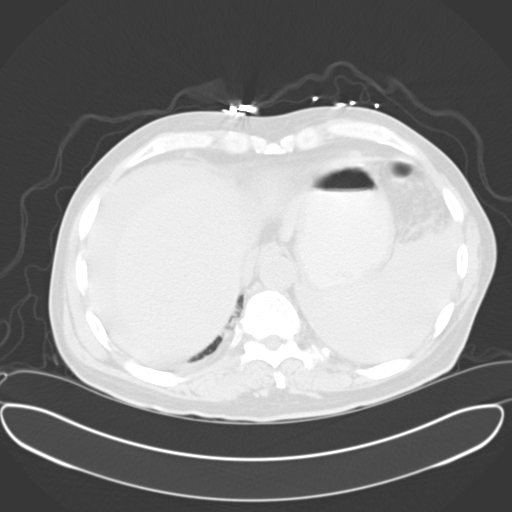
[im 97/111  soft-tissue]
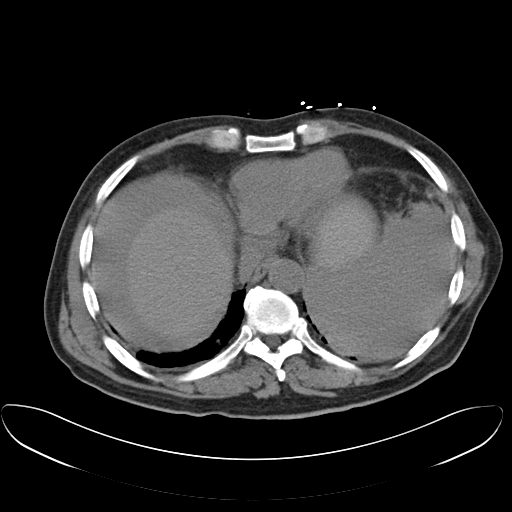
[im 97/111  lung]
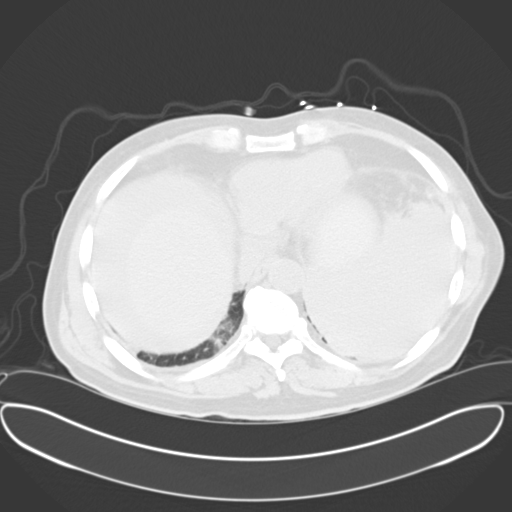
[im 102/111  lung]
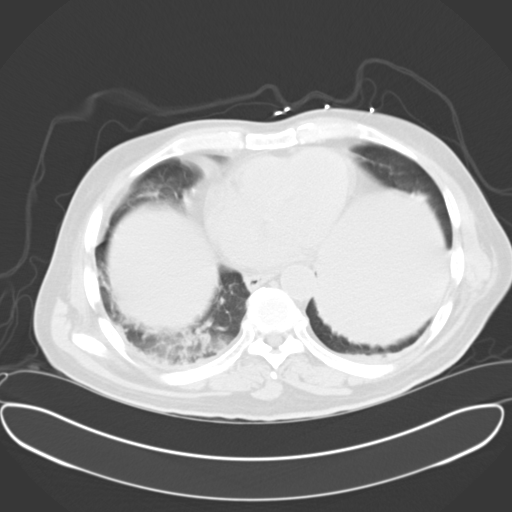
[im 106/111  soft-tissue]
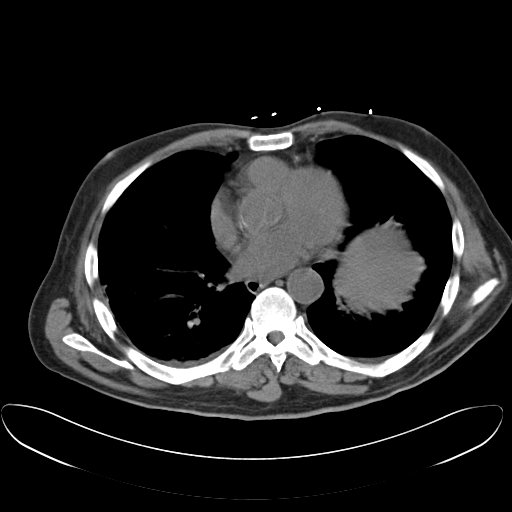
[im 106/111  lung]
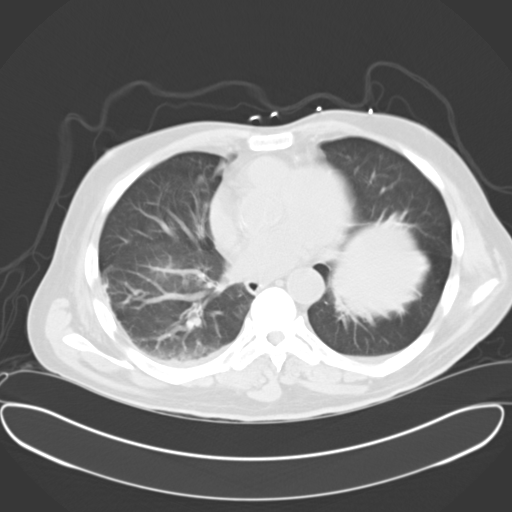

[15 of 32 positions shown; findings below may reference images not displayed]

FINDINGS: Lower chest:  Mild bibasilar atelectasis worse on the right

Hepatobiliary: Negative

Pancreas: Limited evaluation without contrast but no definite
inherent abnormalities. Area of pancreatic head, adjacent duodenum
difficult to evaluate due to evidence of probable hematoma in the
area measuring about 6 by 5 cm.

Spleen: Negative

Adrenals/Urinary Tract: Adrenal glands are negative. Bilateral renal
atrophy. Numerous hyper attenuating tiny renal lesions bilaterally
likely complex cysts.

Stomach/Bowel: Nonobstructive bowel gas pattern. Stool throughout
the large bowel.

Vascular/Lymphatic: Mild atherosclerotic aortoiliac calcification
common iliac artery aneurysm on the left to a diameter of 2.3 cm.

Reproductive: Negative

Other: There is relatively hyper attenuating ascites in the abdomen
and pelvis. Attenuation value is approximately 42. In the right
upper quadrant, there appears to be hyper attenuating fluid
surrounding the descending duodenum. Free fluid is seen in the
bilateral upper quadrants around the liver and spleen, with
inflammatory change in the retroperitoneum around the pancreas and
fluid extending into the lesser sac. Free fluid extends along both
pericolic gutters into the pelvis.

Musculoskeletal: No acute findings
IMPRESSION: Moderate to large volume hemoperitoneum. Site of hemorrhage on
certain but may be in the right upper quadrant near the duodenum and
pancreatic head. Possibility of bleeding peptic ulcer is considered.
Critical Value/emergent results were called by telephone at the time
of interpretation on 03/01/2015 at [DATE] to Dr. DONDRE SCHERER , who
verbally acknowledged these results.

## 2017-03-01 IMAGING — CR DG CHEST 1V PORT
1 series · 1 of 1 positions shown · non-contrast
Comparison: 07/08/2007

CLINICAL DATA: Epigastric pain.  Nausea and vomiting.

EXAM:
PORTABLE CHEST 1 VIEW

[ap]
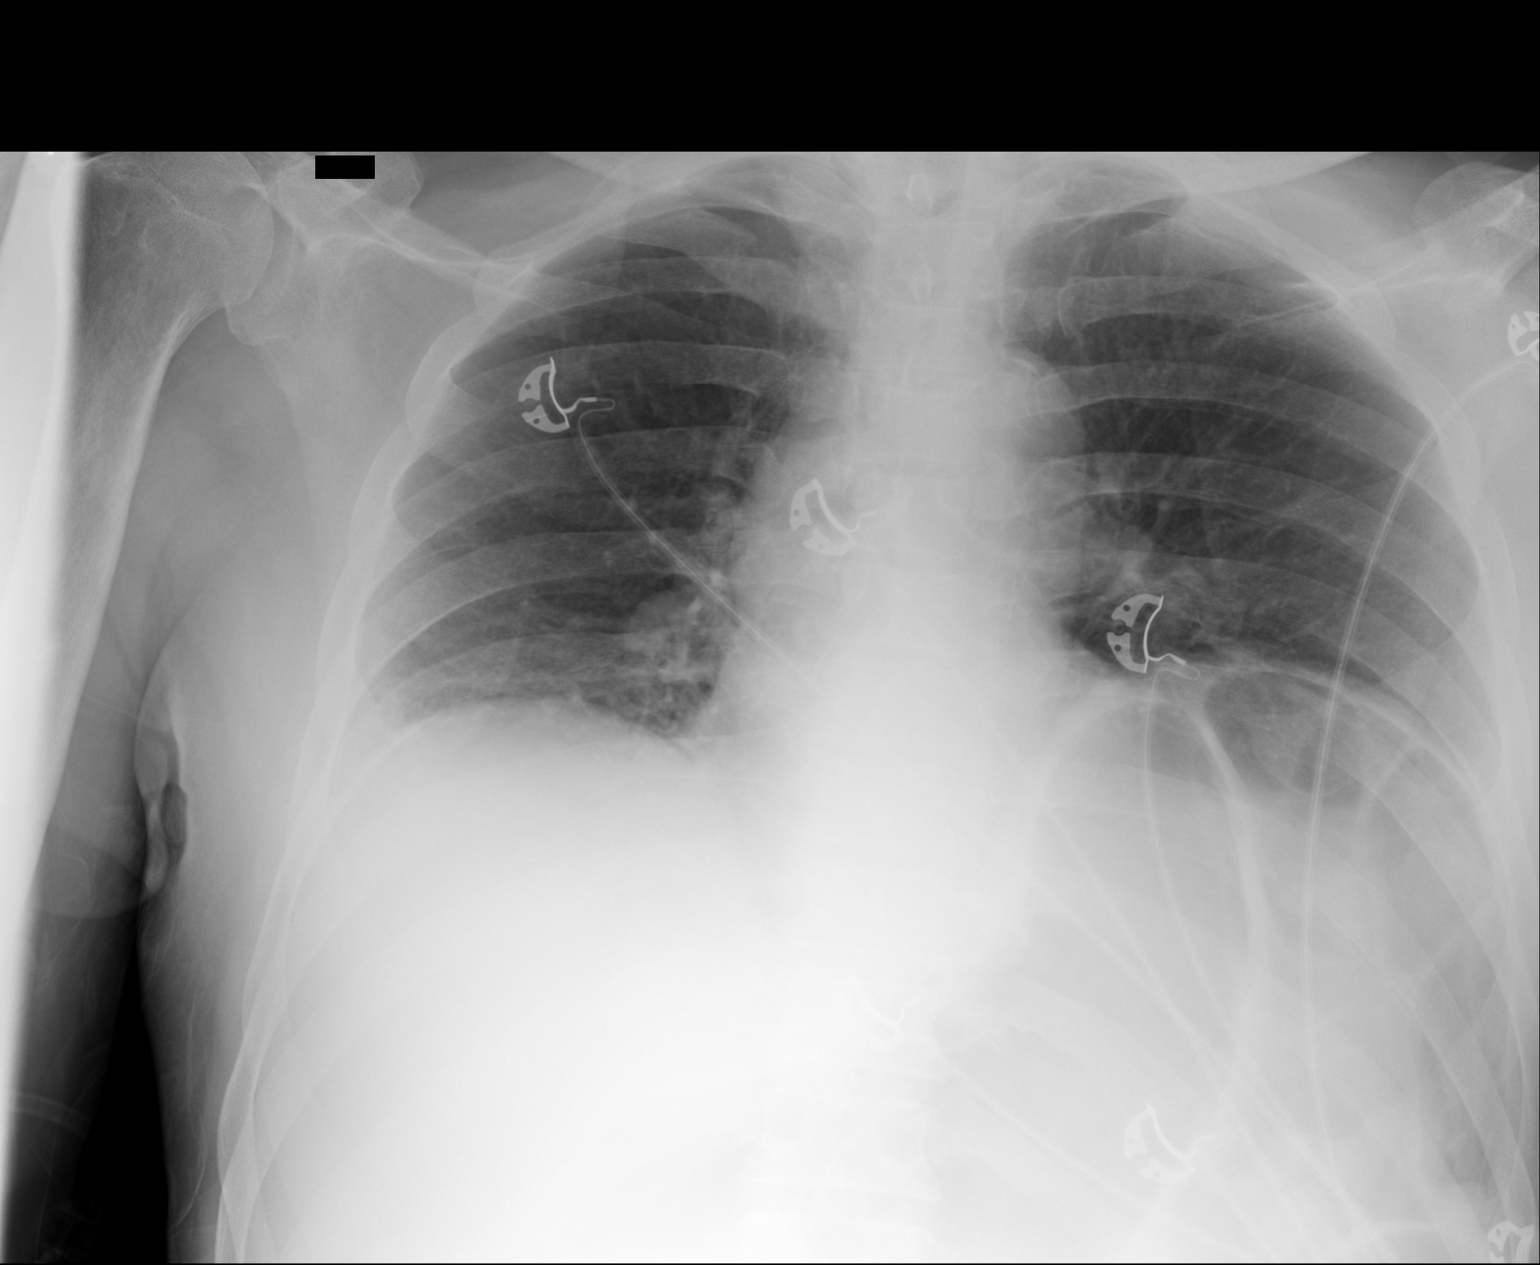

[1 of 1 positions shown; findings below may reference images not displayed]

FINDINGS: Normal cardiac silhouette. There are low lung volumes which are
decreased compared to prior. There is chronic elevation of LEFT
hemidiaphragm. Lung bases are poorly evaluated. Upper lobes are
clear
IMPRESSION: 1. Interval decrease in lung volumes.
2. Upper lobes are clear.

## 2018-09-18 ENCOUNTER — Other Ambulatory Visit: Payer: Self-pay

## 2018-09-26 ENCOUNTER — Encounter: Payer: Self-pay | Admitting: Emergency Medicine

## 2018-09-26 ENCOUNTER — Other Ambulatory Visit: Payer: Self-pay

## 2018-09-26 DIAGNOSIS — Z79899 Other long term (current) drug therapy: Secondary | ICD-10-CM | POA: Insufficient documentation

## 2018-09-26 DIAGNOSIS — F918 Other conduct disorders: Secondary | ICD-10-CM | POA: Diagnosis present

## 2018-09-26 DIAGNOSIS — F0390 Unspecified dementia without behavioral disturbance: Secondary | ICD-10-CM | POA: Insufficient documentation

## 2018-09-26 NOTE — ED Triage Notes (Addendum)
Pt to triage via w/c, mask in place with no distress; brought in by EMS from [facility]; staff reported pt was yelling out and wanted pt brought in to be checked; pt with hx dementia; pt is alert to self only and is denying any c/o pain or recent illness; pt calm & cooperative

## 2018-09-27 ENCOUNTER — Emergency Department
Admission: EM | Admit: 2018-09-27 | Discharge: 2018-09-27 | Disposition: A | Discharge status: Skilled Nursing Facility | Payer: Medicare Other | Attending: Emergency Medicine | Admitting: Emergency Medicine

## 2018-09-27 DIAGNOSIS — F0390 Unspecified dementia without behavioral disturbance: Secondary | ICD-10-CM

## 2018-09-27 HISTORY — DX: Unspecified dementia, unspecified severity, without behavioral disturbance, psychotic disturbance, mood disturbance, and anxiety: F03.90

## 2018-09-27 NOTE — ED Notes (Signed)
Patient seated close to nursing staff desk, in no acute distress.

## 2018-09-27 NOTE — ED Notes (Signed)
Attempted to call patients legal guardian at this time. No answer, voicemail left.

## 2018-09-27 NOTE — ED Notes (Signed)
Pt arrived to room in recliner chair and was able to stand and move to the stretcher. When asked his name, he said "what do you think?". He was not interested in answering any further questions and kept repeating "what do you think". Pt appears comfortable. Door left open for safety.

## 2018-09-27 NOTE — ED Notes (Signed)
Called report to [place] at [facility] assisted living. They stated that the patient had to be sent back via EMS. Will coordinate medical necessity with unit secretary.

## 2018-09-27 NOTE — ED Provider Notes (Signed)
[facility] Emergency Department Provider Note  ____________________________________________   First MD Initiated Contact with Patient 09/27/18 [phone]     (approximate)  I have reviewed the triage vital signs and the nursing notes.   HISTORY  Chief Complaint Medical Clearance  Level 5 caveat:  history/ROS limited by chronic dementia  HPI Joel Mcconnell is a 85 y.o. male with advanced dementia who presents by EMS from his nursing facility given concerns for yelling out and somewhat aggressive behavior.  By the time he arrived in the ED he is calm and cooperative.  He is confused at baseline and is not able to answer questions appropriately but this is apparently chronic.  He denies any pain and when I asked him specific questions he smiled and said "I want to thank you for all that you have done".  No additional history is available or possible.         Past Medical History:  Diagnosis Date  . Dementia [facility])     Patient Active Problem List   Diagnosis Date Noted  . Dementia with behavioral problem ([place]) 08/05/2016  . Hemoperitoneum 03/01/2015  . Abdominal hematoma   . Abdominal pain   . ANXIETY 09/14/2006  . ERECTILE DYSFUNCTION 09/14/2006  . BENIGN POSITIONAL VERTIGO 09/14/2006  . ALLERGIC RHINITIS 09/14/2006  . SPONDYLOSIS 09/14/2006  . SPINAL STENOSIS 09/14/2006  . COLONIC POLYPS, HX OF 09/14/2006    No past surgical history on file.  Prior to Admission medications   Medication Sig Start Date End Date Taking? Authorizing Provider  cetirizine (ZYRTEC) 10 MG tablet Take 10 mg by mouth daily as needed. For anxiety. 03/24/09   [provider]  donepezil (ARICEPT) 5 MG tablet Take 5 mg by mouth at bedtime.    [provider]  QUEtiapine (SEROQUEL) 25 MG tablet Take 1 tablet (25 mg total) by mouth 2 (two) times daily. 08/06/16   Clapacs, Madie Reno, MD    Allergies Patient has no known allergies.  No family history on file.   Social History Social History   Tobacco Use  . Smoking status: Never Smoker  . Smokeless tobacco: Never Used  Substance Use Topics  . Alcohol use: No  . Drug use: No    Review of Systems Level 5 caveat:  history/ROS limited by chronic dementia   ____________________________________________   PHYSICAL EXAM:  VITAL SIGNS: ED Triage Vitals  Enc Vitals Group     BP 09/26/18 2251 127/73     Pulse Rate 09/26/18 2251 72     Resp 09/26/18 2251 18     Temp 09/26/18 2251 98.1 F (36.7 C)     Temp Source 09/26/18 2251 Oral     SpO2 09/26/18 2251 99 %     Weight 09/26/18 2257 74.3 kg (163 lb 12.8 oz)     Height --      Head Circumference --      Peak Flow --      Pain Score 09/26/18 2256 0     Pain Loc --      Pain Edu? --      Excl. in [place]? --     Constitutional: Sleeping but awakens easily.  No acute distress.  Demented at baseline. Head: Atraumatic. Nose: No congestion/rhinnorhea. Mouth/Throat: Mucous membranes are moist. Cardiovascular: Normal rate, regular rhythm. Good peripheral circulation. Grossly normal heart sounds. Respiratory: Normal respiratory effort.  No retractions. Gastrointestinal: Soft and nontender. No distention.  Musculoskeletal: No lower extremity tenderness nor edema.  No gross deformities of extremities. Neurologic: Patient is not able to follow commands sufficiently to perform a full neurological exam, but he is moving all 4 extremities and there is no evidence of any cranial nerve deficits. Skin:  Skin is warm, dry and intact. Psychiatric: Mood and affect are normal.  He is calm and cooperative and though demented he is not exhibiting any aggressive behavior for us.  ____________________________________________   LABS (all labs ordered are listed, but only abnormal results are displayed)  Labs Reviewed - No data to display ____________________________________________  EKG  No indication for EKG ____________________________________________   RADIOLOGY I, Loleta Roseory Susan Arana, personally viewed and evaluated these images (plain radiographs) as part of my medical decision making, as well as reviewing the written report by the radiologist.  ED MD interpretation: No indication for emergent imaging.  Official radiology report(s): No results found.  ____________________________________________   PROCEDURES   Procedure(s) performed (including Critical Care):  Procedures   ____________________________________________   INITIAL IMPRESSION / MDM / ASSESSMENT AND PLAN / ED COURSE  As part of my medical decision making, I reviewed the following data within the electronic MEDICAL RECORD NUMBER Nursing notes reviewed and incorporated, Old chart reviewed and Notes from prior ED visits   The patient is calm and cooperative.  He has been in the emergency department for 6-1/2 hours and has had multiple sets of stable vital signs.  He has a reassuring physical exam and no evidence of any behavioral issues at this time.  I have no medical indication for performing any invasive procedures or lab test.  He was reportedly sent for yelling out at the nursing facility but he has not done so during the 6-1/2 hours he has been in the ED.  I am discharging back to the facility.          ____________________________________________  FINAL CLINICAL IMPRESSION(S) / ED DIAGNOSES  Final diagnoses:  Dementia without behavioral disturbance, unspecified dementia type (HCC)     MEDICATIONS GIVEN DURING THIS VISIT:  Medications - No data to display   ED Discharge Orders    None      *Please note:  Joel Mcconnell was evaluated in Emergency Department on 09/27/2018 for the symptoms described in the history of present illness. He was evaluated in the context of the global COVID-19 pandemic, which necessitated consideration that the patient might be at risk for infection with the SARS-CoV-2 virus that causes COVID-19. Institutional protocols and algorithms  that pertain to the evaluation of patients at risk for COVID-19 are in a state of rapid change based on information released by regulatory bodies including the CDC and federal and state organizations. These policies and algorithms were followed during the patient's care in the ED.  Some ED evaluations and interventions may be delayed as a result of limited staffing during the pandemic.*  Note:  This document was prepared using Dragon voice recognition software and may include unintentional dictation errors.   Loleta RoseForbach, Chariti Havel, MD 09/27/18 [phone]

## 2018-09-27 NOTE — ED Notes (Signed)
Patient placed in recliner near front desk for comfort and d/t fall risk.

## 2018-09-27 NOTE — ED Notes (Signed)
Joel Mcconnell called back. Updated on plan of care and patients discharge.

## 2018-09-27 NOTE — ED Notes (Signed)
Patient resting on stretcher. Even and non labored respirations noted. Awaiting EMS transport back to facility. Will continue to monitor.

## 2018-09-27 NOTE — Discharge Instructions (Signed)
Your workup in the Emergency Department today was reassuring.  We did not find any specific abnormalities.  We recommend you drink plenty of fluids, take your regular medications and/or any new ones prescribed today, and follow up with the doctor(s) listed in these documents as recommended.  Return to the Emergency Department if you develop new or worsening symptoms that concern you.  

## 2018-09-27 NOTE — ED Notes (Signed)
EMS has arrived to transport patient back to facility.

## 2018-10-11 ENCOUNTER — Emergency Department: Payer: Medicare Other

## 2018-10-11 ENCOUNTER — Other Ambulatory Visit: Payer: Self-pay

## 2018-10-11 ENCOUNTER — Inpatient Hospital Stay
Admission: EM | Admit: 2018-10-11 | Discharge: 2018-10-17 | DRG: 056 | Disposition: A | Discharge status: Hospice | Payer: Medicare Other | Attending: Internal Medicine | Admitting: Internal Medicine

## 2018-10-11 ENCOUNTER — Encounter: Payer: Self-pay | Admitting: Emergency Medicine

## 2018-10-11 DIAGNOSIS — N183 Chronic kidney disease, stage 3 (moderate): Secondary | ICD-10-CM | POA: Diagnosis present

## 2018-10-11 DIAGNOSIS — Z515 Encounter for palliative care: Secondary | ICD-10-CM | POA: Diagnosis not present

## 2018-10-11 DIAGNOSIS — F0280 Dementia in other diseases classified elsewhere without behavioral disturbance: Secondary | ICD-10-CM | POA: Diagnosis present

## 2018-10-11 DIAGNOSIS — R627 Adult failure to thrive: Secondary | ICD-10-CM | POA: Diagnosis present

## 2018-10-11 DIAGNOSIS — R1011 Right upper quadrant pain: Secondary | ICD-10-CM

## 2018-10-11 DIAGNOSIS — R410 Disorientation, unspecified: Secondary | ICD-10-CM

## 2018-10-11 DIAGNOSIS — J690 Pneumonitis due to inhalation of food and vomit: Secondary | ICD-10-CM | POA: Diagnosis not present

## 2018-10-11 DIAGNOSIS — Z20828 Contact with and (suspected) exposure to other viral communicable diseases: Secondary | ICD-10-CM | POA: Diagnosis present

## 2018-10-11 DIAGNOSIS — R4182 Altered mental status, unspecified: Secondary | ICD-10-CM | POA: Diagnosis not present

## 2018-10-11 DIAGNOSIS — R059 Cough, unspecified: Secondary | ICD-10-CM

## 2018-10-11 DIAGNOSIS — E860 Dehydration: Secondary | ICD-10-CM | POA: Diagnosis present

## 2018-10-11 DIAGNOSIS — Z79899 Other long term (current) drug therapy: Secondary | ICD-10-CM

## 2018-10-11 DIAGNOSIS — R05 Cough: Secondary | ICD-10-CM

## 2018-10-11 DIAGNOSIS — Z66 Do not resuscitate: Secondary | ICD-10-CM | POA: Diagnosis not present

## 2018-10-11 DIAGNOSIS — R54 Age-related physical debility: Secondary | ICD-10-CM | POA: Diagnosis present

## 2018-10-11 DIAGNOSIS — G309 Alzheimer's disease, unspecified: Principal | ICD-10-CM | POA: Diagnosis present

## 2018-10-11 DIAGNOSIS — J9601 Acute respiratory failure with hypoxia: Secondary | ICD-10-CM | POA: Diagnosis not present

## 2018-10-11 LAB — URINALYSIS, COMPLETE (UACMP) WITH MICROSCOPIC
Bacteria, UA: NONE SEEN
Bilirubin Urine: NEGATIVE
Glucose, UA: NEGATIVE mg/dL
Ketones, ur: 5 mg/dL — AB
Leukocytes,Ua: NEGATIVE
Nitrite: NEGATIVE
Protein, ur: NEGATIVE mg/dL
Specific Gravity, Urine: 1.018 (ref 1.005–1.030)
Squamous Epithelial / HPF: NONE SEEN (ref 0–5)
pH: 5 (ref 5.0–8.0)

## 2018-10-11 LAB — CBC
HCT: 41 % (ref 39.0–52.0)
Hemoglobin: 13.5 g/dL (ref 13.0–17.0)
MCH: 32.1 pg (ref 26.0–34.0)
MCHC: 32.9 g/dL (ref 30.0–36.0)
MCV: 97.4 fL (ref 80.0–100.0)
Platelets: 156 10*3/uL (ref 150–400)
RBC: 4.21 MIL/uL — ABNORMAL LOW (ref 4.22–5.81)
RDW: 12.4 % (ref 11.5–15.5)
WBC: 14.2 10*3/uL — ABNORMAL HIGH (ref 4.0–10.5)
nRBC: 0 % (ref 0.0–0.2)

## 2018-10-11 LAB — COMPREHENSIVE METABOLIC PANEL
ALT: 24 U/L (ref 0–44)
AST: 148 U/L — ABNORMAL HIGH (ref 15–41)
Albumin: 3.7 g/dL (ref 3.5–5.0)
Alkaline Phosphatase: 66 U/L (ref 38–126)
Anion gap: 10 (ref 5–15)
BUN: 27 mg/dL — ABNORMAL HIGH (ref 8–23)
CO2: 27 mmol/L (ref 22–32)
Calcium: 9.3 mg/dL (ref 8.9–10.3)
Chloride: 108 mmol/L (ref 98–111)
Creatinine, Ser: 1.65 mg/dL — ABNORMAL HIGH (ref 0.61–1.24)
GFR calc Af Amer: 42 mL/min — ABNORMAL LOW (ref >60)
GFR calc non Af Amer: 37 mL/min — ABNORMAL LOW (ref >60)
Glucose, Bld: 99 mg/dL (ref 70–99)
Potassium: 4.2 mmol/L (ref 3.5–5.1)
Sodium: 145 mmol/L (ref 135–145)
Total Bilirubin: 0.9 mg/dL (ref 0.3–1.2)
Total Protein: 6.4 g/dL — ABNORMAL LOW (ref 6.5–8.1)

## 2018-10-11 LAB — TROPONIN I (HIGH SENSITIVITY)
Troponin I (High Sensitivity): 15 ng/L (ref <18)
Troponin I (High Sensitivity): 15 ng/L (ref <18)

## 2018-10-11 LAB — VALPROIC ACID LEVEL: Valproic Acid Lvl: 27 ug/mL — ABNORMAL LOW (ref 50.0–100.0)

## 2018-10-11 LAB — GLUCOSE, CAPILLARY: Glucose-Capillary: 95 mg/dL (ref 70–99)

## 2018-10-11 MED ORDER — ACETAMINOPHEN 500 MG PO TABS: 500.0000 mg | ORAL_TABLET | Freq: Four times a day (QID) | ORAL | Status: DC | PRN

## 2018-10-11 MED ORDER — SODIUM CHLORIDE 0.9 % IV SOLN
Freq: Once | INTRAVENOUS | Status: AC
  Administered 2018-10-11: 18:00:00 via INTRAVENOUS

## 2018-10-11 MED ORDER — DOCUSATE SODIUM 100 MG PO CAPS: 100.0000 mg | ORAL_CAPSULE | Freq: Two times a day (BID) | ORAL | Status: DC | PRN

## 2018-10-11 MED ORDER — MAGNESIUM HYDROXIDE 400 MG/5ML PO SUSP: 30.0000 mL | Freq: Every day | ORAL | Status: DC | PRN

## 2018-10-11 MED ORDER — DIVALPROEX SODIUM 250 MG PO DR TAB
250.0000 mg | DELAYED_RELEASE_TABLET | Freq: Every evening | ORAL | Status: DC
  Filled 2018-10-11 (×2): qty 1

## 2018-10-11 MED ORDER — SODIUM CHLORIDE 0.9 % IV BOLUS
1000.0000 mL | Freq: Once | INTRAVENOUS | Status: AC
  Administered 2018-10-11: 1000 mL via INTRAVENOUS

## 2018-10-11 MED ORDER — SODIUM CHLORIDE 0.9 % IV SOLN
INTRAVENOUS | Status: DC
  Administered 2018-10-11 – 2018-10-13 (×5): via INTRAVENOUS

## 2018-10-11 MED ORDER — DIVALPROEX SODIUM 125 MG PO DR TAB
125.0000 mg | DELAYED_RELEASE_TABLET | Freq: Two times a day (BID) | ORAL | Status: DC
  Filled 2018-10-11 (×2): qty 1

## 2018-10-11 MED ORDER — HEPARIN SODIUM (PORCINE) 5000 UNIT/ML IJ SOLN
5000.0000 [IU] | Freq: Three times a day (TID) | INTRAMUSCULAR | Status: DC
  Administered 2018-10-11 – 2018-10-15 (×11): 5000 [IU] via SUBCUTANEOUS
  Filled 2018-10-11 (×11): qty 1

## 2018-10-11 MED ORDER — AMLODIPINE BESYLATE 10 MG PO TABS
10.0000 mg | ORAL_TABLET | Freq: Every day | ORAL | Status: DC
  Administered 2018-10-13: 10:00:00 10 mg via ORAL
  Filled 2018-10-11: qty 1

## 2018-10-11 NOTE — ED Notes (Signed)
Pt off floor to CT

## 2018-10-11 NOTE — ED Notes (Signed)
Pt transported to room 208 

## 2018-10-11 NOTE — ED Triage Notes (Signed)
Pt arrives via ems from [place] with concerns over altered mental status. Per ems report pt is normally independent and mentally a&o x 4. However, today upon initial assessment pt too weak to stand and nonverbal. Pt will not verbalize answers in triage but does nod, unsure of appropriateness of response. Facility reports low grade fever and tachycardia. Triage afebrile at 98.8 and HR of 78.

## 2018-10-11 NOTE — ED Notes (Signed)
Pt resting with eyes closed  Will continue to monitor

## 2018-10-11 NOTE — ED Notes (Signed)
Pharmacist, hospital, social worker:  [phone]

## 2018-10-11 NOTE — Progress Notes (Signed)
Unable to complete admission questions. Pt confused and only oriented to name. Did not know birthday.

## 2018-10-11 NOTE — ED Notes (Signed)
Troponin collected and sent to lab.

## 2018-10-11 NOTE — ED Provider Notes (Addendum)
[facility] Emergency Department Provider Note  ____________________________________________   First MD Initiated Contact with Patient 10/11/18 1120     (approximate)  I have reviewed the triage vital signs and the nursing notes.   HISTORY  Chief Complaint Altered Mental Status    HPI Joel Mcconnell is a 85 y.o. male with history of dementia here with reported altered mental status.  Patient reportedly is normally more awake and alert than usual.  He was found this morning and was significant more confused.  He has not had any cough.  He does have sick contacts in the facility that he lives in but no one near him.  He is unable to provide further history.  Level 5 caveat invoked as remainder of history, ROS, and physical exam limited due to patient's AMS.         Past Medical History:  Diagnosis Date   Dementia [facility])     Patient Active Problem List   Diagnosis Date Noted   Altered mental status 10/11/2018   Dementia with behavioral problem ([place]) 08/05/2016   Hemoperitoneum 03/01/2015   Abdominal hematoma    Abdominal pain    ANXIETY 09/14/2006   ERECTILE DYSFUNCTION 09/14/2006   BENIGN POSITIONAL VERTIGO 09/14/2006   ALLERGIC RHINITIS 09/14/2006   SPONDYLOSIS 09/14/2006   SPINAL STENOSIS 09/14/2006   COLONIC POLYPS, HX OF 09/14/2006    History reviewed. No pertinent surgical history.  Prior to Admission medications   Medication Sig Start Date End Date Taking? Authorizing Provider  acetaminophen (TYLENOL) 500 MG tablet Take 500-1,000 mg by mouth every 6 (six) hours as needed for mild pain or fever.   Yes [provider]  alum & mag hydroxide-simeth (MINTOX) 200-200-20 MG/5ML suspension Take 30 mLs by mouth daily as needed for indigestion or heartburn.   Yes [provider]  cetirizine (ZYRTEC) 10 MG tablet Take 10 mg by mouth daily as needed for allergies (anxiety).    Yes [provider]    divalproex (DEPAKOTE) 125 MG DR tablet Take 125-250 mg by mouth See admin instructions. Take 1 tablet (125mg ) by mouth daily at 8AM and 12PM and take 2 tablets (250mg ) by mouth every night at [facility] 09/01/18  Yes [provider]  donepezil (ARICEPT) 10 MG tablet Take 10 mg by mouth at bedtime. 09/01/18  Yes [provider]  loperamide (IMODIUM) 2 MG capsule Take 2 mg by mouth as needed for diarrhea or loose stools.   Yes [provider]  magnesium hydroxide (MILK OF MAGNESIA) 400 MG/5ML suspension Take 30 mLs by mouth daily as needed for mild constipation or moderate constipation.   Yes [provider]  QUEtiapine (SEROQUEL) 200 MG tablet Take 200 mg by mouth at bedtime. 08/25/18  Yes [provider]  risperiDONE (RISPERDAL) 1 MG tablet Take 1 mg by mouth daily at 2 PM. 10/10/18  Yes [provider]    Allergies Patient has no known allergies.  No family history on file.  Social History Social History   Tobacco Use   Smoking status: Never Smoker   Smokeless tobacco: Never Used  Substance Use Topics   Alcohol use: No   Drug use: No    Review of Systems  Review of Systems  Unable to perform ROS: Dementia     ____________________________________________  PHYSICAL EXAM:      VITAL SIGNS: ED Triage Vitals [10/11/18 1052]  Enc Vitals Group     BP 128/86     Pulse Rate  70     Resp 18     Temp 98.8 F (37.1 C)     Temp Source Oral     SpO2 93 %     Weight 165 lb 5.5 oz (75 kg)     Height 5\' 11"  (1.803 m)     Head Circumference      Peak Flow      Pain Score      Pain Loc      Pain Edu?      Excl. in [place]?      Physical Exam Vitals signs and nursing note reviewed.  Constitutional:      General: He is sleeping. He is not in acute distress.    Appearance: He is well-developed.  HENT:     Head: Normocephalic and atraumatic.     Mouth/Throat:     Mouth: Mucous membranes are dry.  Eyes:     Conjunctiva/sclera:  Conjunctivae normal.  Neck:     Musculoskeletal: Neck supple.  Cardiovascular:     Rate and Rhythm: Normal rate and regular rhythm.     Heart sounds: Normal heart sounds.  Pulmonary:     Effort: Pulmonary effort is normal. No respiratory distress.     Breath sounds: No wheezing.  Abdominal:     General: There is no distension.     Tenderness: There is no abdominal tenderness. There is no guarding or rebound.  Skin:    General: Skin is warm.     Capillary Refill: Capillary refill takes less than 2 seconds.     Findings: No rash.  Neurological:     Mental Status: He is confused.     Motor: No abnormal muscle tone.     Comments: Moves all extremities.  No obvious cranial nerve deficits.       ____________________________________________   LABS (all labs ordered are listed, but only abnormal results are displayed)  Labs Reviewed  COMPREHENSIVE METABOLIC PANEL - Abnormal; Notable for the following components:      Result Value   BUN 27 (*)    Creatinine, Ser 1.65 (*)    Total Protein 6.4 (*)    AST 148 (*)    GFR calc non Af Amer 37 (*)    GFR calc Af Amer 42 (*)    All other components within normal limits  CBC - Abnormal; Notable for the following components:   WBC 14.2 (*)    RBC 4.21 (*)    All other components within normal limits  URINALYSIS, COMPLETE (UACMP) WITH MICROSCOPIC - Abnormal; Notable for the following components:   Color, Urine YELLOW (*)    APPearance CLEAR (*)    Hgb urine dipstick MODERATE (*)    Ketones, ur 5 (*)    All other components within normal limits  VALPROIC ACID LEVEL - Abnormal; Notable for the following components:   Valproic Acid Lvl 27 (*)    All other components within normal limits  SARS CORONAVIRUS 2 (TAT 6-24 HRS)  CULTURE, BLOOD (SINGLE)  BASIC METABOLIC PANEL  CBC  TROPONIN I (HIGH SENSITIVITY)  TROPONIN I (HIGH SENSITIVITY)    ____________________________________________  EKG: Normal sinus rhythm, with PVCs. VR 63, PR  180, QRS 90, QTc 397. No acute significant changes compared to prior in Jun 2018. ________________________________________  RADIOLOGY All imaging, including plain films, CT scans, and ultrasounds, independently reviewed by me, and interpretations confirmed via formal radiology reads.  ED MD interpretation:   CXR: Negative CT Head: NAICA  Official  radiology report(s): Dg Chest 2 View  Result Date: 10/11/2018 CLINICAL DATA:  Altered mental status EXAM: CHEST - 2 VIEW COMPARISON:  July 24, 2016 FINDINGS: There is stable elevation of the left hemidiaphragm with adjacent atelectasis. There is no pneumothorax. No large pleural effusion. No focal infiltrate. The heart size is stable. Aortic calcifications are noted. There is no acute osseous abnormality. IMPRESSION: No active cardiopulmonary disease. Electronically Signed   By: Katherine Mantlehristopher  Green M.D.   On: 10/11/2018 11:17   Ct Head Wo Contrast  Result Date: 10/11/2018 CLINICAL DATA:  Altered mental status. Encephalopathy. EXAM: CT HEAD WITHOUT CONTRAST TECHNIQUE: Contiguous axial images were obtained from the base of the skull through the vertex without intravenous contrast. COMPARISON:  CT scan dated 07/23/2016 FINDINGS: Brain: No evidence of acute infarction, hemorrhage, hydrocephalus, extra-axial collection or mass lesion/mass effect. There is diffuse chronic cerebral cortical and cerebellar atrophy. The atrophy is most prominent in the temporal lobes. There is chronic extensive periventricular white matter lucency, increased since the prior study consistent with chronic small vessel ischemic disease. Vascular: No hyperdense vessel or unexpected calcification. Skull: Normal. Negative for fracture or focal lesion. Sinuses/Orbits: Normal. Other: None IMPRESSION: 1. No acute intracranial abnormality. Diffuse chronic small vessel ischemic disease, increased since the prior study. 2. Progressive atrophy, most prominent in the temporal lobes. Electronically  Signed   By: Francene BoyersJames  Maxwell M.D.   On: 10/11/2018 13:32   [place] Abdomen Limited Ruq  Result Date: 10/11/2018 CLINICAL DATA:  85 year old male with right upper quadrant abdominal pain. EXAM: ULTRASOUND ABDOMEN LIMITED RIGHT UPPER QUADRANT COMPARISON:  CT of the abdomen pelvis dated 03/02/2015 FINDINGS: Gallbladder: No gallstones or wall thickening visualized. No sonographic Murphy sign noted by sonographer. Common bile duct: Diameter: 4 mm Liver: Mild coarsened liver echotexture. Evaluation of the liver is limited due to overlying bowel gas. Portal vein is patent on color Doppler imaging with normal direction of blood flow towards the liver. Other: Irregularity of the right renal contour consistent with areas of infarct. IMPRESSION: Unremarkable right upper quadrant ultrasound. Electronically Signed   By: Elgie CollardArash  Radparvar M.D.   On: 10/11/2018 14:46    ____________________________________________  PROCEDURES   Procedure(s) performed (including Critical Care):  Procedures  ____________________________________________  INITIAL IMPRESSION / MDM / ASSESSMENT AND PLAN / ED COURSE  As part of my medical decision making, I reviewed the following data within the electronic MEDICAL RECORD NUMBER Notes from prior ED visits and  Controlled Substance Database      *Magdalene PatriciaJames C Kastens was evaluated in Emergency Department on 10/11/2018 for the symptoms described in the history of present illness. He was evaluated in the context of the global COVID-19 pandemic, which necessitated consideration that the patient might be at risk for infection with the SARS-CoV-2 virus that causes COVID-19. Institutional protocols and algorithms that pertain to the evaluation of patients at risk for COVID-19 are in a state of rapid change based on information released by regulatory bodies including the CDC and federal and state organizations. These policies and algorithms were followed during the patient's care in the ED.  Some ED  evaluations and interventions may be delayed as a result of limited staffing during the pandemic.*      Medical Decision Making: 85 yo M here with AMS/encephalopathy. Labs show elevated BUN:Cr ratio, ketonuria, consistent with dehydration. Otherwise, no signs of UTI, PNA, or other occult infection. COVID is pending. CT head is without acute abnormality and pt has no new focal abnormalities on exam - doubt  CVA, bleed. Admit for rehydration, management.  ____________________________________________  FINAL CLINICAL IMPRESSION(S) / ED DIAGNOSES  Final diagnoses:  RUQ pain  Dehydration  Confusion     MEDICATIONS GIVEN DURING THIS VISIT:  Medications  acetaminophen (TYLENOL) tablet 500-1,000 mg (has no administration in time range)  magnesium hydroxide (MILK OF MAGNESIA) suspension 30 mL (has no administration in time range)  divalproex (DEPAKOTE) DR tablet 125 mg (has no administration in time range)  docusate sodium (COLACE) capsule 100 mg (has no administration in time range)  heparin injection 5,000 Units (has no administration in time range)  0.9 %  sodium chloride infusion ( Intravenous New Bag/Given 10/11/18 1847)  divalproex (DEPAKOTE) DR tablet 250 mg (has no administration in time range)  sodium chloride 0.9 % bolus 1,000 mL (0 mLs Intravenous Stopped 10/11/18 1353)  0.9 %  sodium chloride infusion ( Intravenous New Bag/Given 10/11/18 1814)     ED Discharge Orders    None       Note:  This document was prepared using Dragon voice recognition software and may include unintentional dictation errors.   Shaune Pollack, MD 10/11/18 Carlis Stable    Shaune Pollack, MD 10/20/18 1213

## 2018-10-11 NOTE — H&P (Signed)
[employer] - [place] at [facility]   PATIENT NAME: Joel Mcconnell    MR#:  [id number]  DATE OF BIRTH:  [birth year]  DATE OF ADMISSION:  10/11/2018  PRIMARY CARE PHYSICIAN: [facility]   REQUESTING/REFERRING PHYSICIAN: [email or web address]  CHIEF COMPLAINT:   Chief Complaint  Patient presents with  . Altered Mental Status    HISTORY OF PRESENT ILLNESS: Joel Mcconnell  is a 85 y.o. male with a known history of dementia and lives in a nursing facility.  At baseline he is independent in day-to-day activities and talking.  Today the facility found him altered mental status and drowsy and not talking so sent to emergency room.  As per the facility report to given to the EMS and nurse he was having low-grade fever and tachycardia. Patient is confused and cannot give me much detail.  He is not talking much to me.  He opens eyes and look at me. I tried calling his contact number in the chart which is his wife, did not pick up the phone so could not get much details. ER dated initial work-up including infection and CT scan of head and did not find any clear reasons for altered mental status.  His white blood cell count is high and he appears slightly dehydrated.  UA is negative.  COVID-19 test is sent but result is pending.  PAST MEDICAL HISTORY:   Past Medical History:  Diagnosis Date  . Dementia (HCC)     PAST SURGICAL HISTORY: History reviewed. No pertinent surgical history.  SOCIAL HISTORY:  Social History   Tobacco Use  . Smoking status: Never Smoker  . Smokeless tobacco: Never Used  Substance Use Topics  . Alcohol use: No    FAMILY HISTORY: No family history on file.  DRUG ALLERGIES: No Known Allergies  REVIEW OF SYSTEMS:   Due to altered mental status patient cannot give review of system.  MEDICATIONS AT HOME:  Prior to Admission medications   Medication Sig Start Date End Date Taking? Authorizing Provider  acetaminophen (TYLENOL) 500 MG tablet Take 500-1,000  mg by mouth every 6 (six) hours as needed for mild pain or fever.   Yes [provider]  alum & mag hydroxide-simeth (MINTOX) 200-200-20 MG/5ML suspension Take 30 mLs by mouth daily as needed for indigestion or heartburn.   Yes [provider]  cetirizine (ZYRTEC) 10 MG tablet Take 10 mg by mouth daily as needed for allergies (anxiety).    Yes [provider]  divalproex (DEPAKOTE) 125 MG DR tablet Take 125-250 mg by mouth See admin instructions. Take 1 tablet (125mg ) by mouth daily at 8AM and 12PM and take 2 tablets (250mg ) by mouth every night at [facility] 09/01/18  Yes [provider]  donepezil (ARICEPT) 10 MG tablet Take 10 mg by mouth at bedtime. 09/01/18  Yes [provider]  loperamide (IMODIUM) 2 MG capsule Take 2 mg by mouth as needed for diarrhea or loose stools.   Yes [provider]  magnesium hydroxide (MILK OF MAGNESIA) 400 MG/5ML suspension Take 30 mLs by mouth daily as needed for mild constipation or moderate constipation.   Yes [provider]  QUEtiapine (SEROQUEL) 200 MG tablet Take 200 mg by mouth at bedtime. 08/25/18  Yes [provider]  risperiDONE (RISPERDAL) 1 MG tablet Take 1 mg by mouth daily at 2 PM. 10/10/18  Yes [provider]      PHYSICAL EXAMINATION:   VITAL SIGNS: Blood pressure 133/80, pulse 72, temperature  98.8 F (37.1 C), temperature source Oral, resp. rate 16, height 5\' 11"  (1.803 m), weight 75 kg, SpO2 100 %.  GENERAL:  85 y.o.-year-old patient lying in the bed with no acute distress.  EYES: Pupils equal, round, reactive to light and accommodation. No scleral icterus. Extraocular muscles intact.  HEENT: Head atraumatic, normocephalic. Oropharynx and nasopharynx clear.  Mucosa dry. NECK:  Supple, no jugular venous distention. No thyroid enlargement, no tenderness.  LUNGS: Normal breath sounds bilaterally, no wheezing, rales,rhonchi or crepitation. No use of accessory muscles of  respiration.  CARDIOVASCULAR: S1, S2 normal. No murmurs, rubs, or gallops.  ABDOMEN: Soft, nontender, nondistended. Bowel sounds present. No organomegaly or mass.  EXTREMITIES: No pedal edema, cyanosis, or clubbing.  NEUROLOGIC: Cranial nerves II through XII are intact. Muscle strength 3/5 in all extremities.  Some shaking in both upper extremities.  Sensation intact. Gait not checked.  PSYCHIATRIC: The patient is alert and oriented x 0, not talking.  SKIN: No obvious rash, lesion, or ulcer.   LABORATORY PANEL:   CBC Recent Labs  Lab 10/11/18 1055  WBC 14.2*  HGB 13.5  HCT 41.0  PLT 156  MCV 97.4  MCH 32.1  MCHC 32.9  RDW 12.4   ------------------------------------------------------------------------------------------------------------------  Chemistries  Recent Labs  Lab 10/11/18 1055  NA 145  K 4.2  CL 108  CO2 27  GLUCOSE 99  BUN 27*  CREATININE 1.65*  CALCIUM 9.3  AST 148*  ALT 24  ALKPHOS 66  BILITOT 0.9   ------------------------------------------------------------------------------------------------------------------ estimated creatinine clearance is 32.8 mL/min (A) (by C-G formula based on SCr of 1.65 mg/dL (H)). ------------------------------------------------------------------------------------------------------------------ No results for input(s): TSH, T4TOTAL, T3FREE, THYROIDAB in the last 72 hours.  Invalid input(s): FREET3   Coagulation profile No results for input(s): INR, PROTIME in the last 168 hours. ------------------------------------------------------------------------------------------------------------------- No results for input(s): DDIMER in the last 72 hours. -------------------------------------------------------------------------------------------------------------------  Cardiac Enzymes No results for input(s): CKMB, TROPONINI, MYOGLOBIN in the last 168 hours.  Invalid input(s):  CK ------------------------------------------------------------------------------------------------------------------ Invalid input(s): POCBNP  ---------------------------------------------------------------------------------------------------------------  Urinalysis    Component Value Date/Time   COLORURINE YELLOW (A) 10/11/2018 1224   APPEARANCEUR CLEAR (A) 10/11/2018 1224   LABSPEC 1.018 10/11/2018 1224   PHURINE 5.0 10/11/2018 1224   GLUCOSEU NEGATIVE 10/11/2018 1224   HGBUR MODERATE (A) 10/11/2018 1224   BILIRUBINUR NEGATIVE 10/11/2018 1224   KETONESUR 5 (A) 10/11/2018 1224   PROTEINUR NEGATIVE 10/11/2018 1224   NITRITE NEGATIVE 10/11/2018 1224   LEUKOCYTESUR NEGATIVE 10/11/2018 1224     RADIOLOGY: Dg Chest 2 View  Result Date: 10/11/2018 CLINICAL DATA:  Altered mental status EXAM: CHEST - 2 VIEW COMPARISON:  July 24, 2016 FINDINGS: There is stable elevation of the left hemidiaphragm with adjacent atelectasis. There is no pneumothorax. No large pleural effusion. No focal infiltrate. The heart size is stable. Aortic calcifications are noted. There is no acute osseous abnormality. IMPRESSION: No active cardiopulmonary disease. Electronically Signed   By: Constance Holster M.D.   On: 10/11/2018 11:17   Ct Head Wo Contrast  Result Date: 10/11/2018 CLINICAL DATA:  Altered mental status. Encephalopathy. EXAM: CT HEAD WITHOUT CONTRAST TECHNIQUE: Contiguous axial images were obtained from the base of the skull through the vertex without intravenous contrast. COMPARISON:  CT scan dated 07/23/2016 FINDINGS: Brain: No evidence of acute infarction, hemorrhage, hydrocephalus, extra-axial collection or mass lesion/mass effect. There is diffuse chronic cerebral cortical and cerebellar atrophy. The atrophy is most prominent in the temporal lobes. There is chronic extensive periventricular white matter lucency, increased  since the prior study consistent with chronic small vessel ischemic disease.  Vascular: No hyperdense vessel or unexpected calcification. Skull: Normal. Negative for fracture or focal lesion. Sinuses/Orbits: Normal. Other: None IMPRESSION: 1. No acute intracranial abnormality. Diffuse chronic small vessel ischemic disease, increased since the prior study. 2. Progressive atrophy, most prominent in the temporal lobes. Electronically Signed   By: Francene BoyersJames  Maxwell M.D.   On: 10/11/2018 13:32   [place] Abdomen Limited Ruq  Result Date: 10/11/2018 CLINICAL DATA:  85 year old male with right upper quadrant abdominal pain. EXAM: ULTRASOUND ABDOMEN LIMITED RIGHT UPPER QUADRANT COMPARISON:  CT of the abdomen pelvis dated 03/02/2015 FINDINGS: Gallbladder: No gallstones or wall thickening visualized. No sonographic Murphy sign noted by sonographer. Common bile duct: Diameter: 4 mm Liver: Mild coarsened liver echotexture. Evaluation of the liver is limited due to overlying bowel gas. Portal vein is patent on color Doppler imaging with normal direction of blood flow towards the liver. Other: Irregularity of the right renal contour consistent with areas of infarct. IMPRESSION: Unremarkable right upper quadrant ultrasound. Electronically Signed   By: Elgie CollardArash  Radparvar M.D.   On: 10/11/2018 14:46    EKG: Orders placed or performed during the hospital encounter of 10/11/18  . ED EKG  . ED EKG    IMPRESSION AND PLAN:  *Altered mental status Likely dehydration COVID-19 test result is still pending. I will give IV fluids. Monitor renal function and for mental status improvement. Patient has baseline dementia but he is able to communicate and he is independent at nursing home. CT scan head is negative for acute findings. I have sent blood culture also, as per the nursing home report he had some tachycardia and low-grade fever. Patient is taking Seroquel, risperidone, Aricept and cetrizine -I will hold all of them. He also takes Depakote- I will check the level.  *Dementia Holding his medications  for now.  He lives at nursing home facility for that.  *Chronic kidney disease stage III Appears to be at baseline, avoid nephrotoxic medication and keep on IV fluids and monitor.  All the records are reviewed and case discussed with ED provider. Management plans discussed with the patient, family and they are in agreement.  CODE STATUS: Full code Code Status History    Date Active Date Inactive Code Status Order ID Comments User Context   03/01/2015 2023 03/02/2015 1824 Full Code [id number]  Lattie Hawooper, Richard E, MD ED   Advance Care Planning Activity       TOTAL TIME TAKING CARE OF THIS PATIENT: 50 minutes.    Altamese DillingVaibhavkumar Sharen Youngren M.D on 10/11/2018   Between 7am to 6pm - Pager - [phone]  After 6pm go to [email or web address] - password [email or web address]  [facility] [place] [facility]  Office  [phone]  CC: Primary care physician; [facility]   Note: This dictation was prepared with Dragon dictation along with smaller phrase technology. Any transcriptional errors that result from this process are unintentional.

## 2018-10-12 DIAGNOSIS — E860 Dehydration: Secondary | ICD-10-CM | POA: Diagnosis present

## 2018-10-12 DIAGNOSIS — R4182 Altered mental status, unspecified: Secondary | ICD-10-CM | POA: Diagnosis present

## 2018-10-12 DIAGNOSIS — Z515 Encounter for palliative care: Secondary | ICD-10-CM | POA: Diagnosis not present

## 2018-10-12 DIAGNOSIS — F0280 Dementia in other diseases classified elsewhere without behavioral disturbance: Secondary | ICD-10-CM | POA: Diagnosis present

## 2018-10-12 DIAGNOSIS — Z20828 Contact with and (suspected) exposure to other viral communicable diseases: Secondary | ICD-10-CM | POA: Diagnosis present

## 2018-10-12 DIAGNOSIS — Z66 Do not resuscitate: Secondary | ICD-10-CM | POA: Diagnosis not present

## 2018-10-12 DIAGNOSIS — J690 Pneumonitis due to inhalation of food and vomit: Secondary | ICD-10-CM | POA: Diagnosis not present

## 2018-10-12 DIAGNOSIS — N183 Chronic kidney disease, stage 3 (moderate): Secondary | ICD-10-CM | POA: Diagnosis present

## 2018-10-12 DIAGNOSIS — Z79899 Other long term (current) drug therapy: Secondary | ICD-10-CM | POA: Diagnosis not present

## 2018-10-12 DIAGNOSIS — R54 Age-related physical debility: Secondary | ICD-10-CM | POA: Diagnosis present

## 2018-10-12 DIAGNOSIS — J9601 Acute respiratory failure with hypoxia: Secondary | ICD-10-CM | POA: Diagnosis not present

## 2018-10-12 DIAGNOSIS — G309 Alzheimer's disease, unspecified: Secondary | ICD-10-CM | POA: Diagnosis present

## 2018-10-12 DIAGNOSIS — Z7189 Other specified counseling: Secondary | ICD-10-CM | POA: Diagnosis not present

## 2018-10-12 DIAGNOSIS — R627 Adult failure to thrive: Secondary | ICD-10-CM | POA: Diagnosis present

## 2018-10-12 LAB — BASIC METABOLIC PANEL
Anion gap: 8 (ref 5–15)
BUN: 21 mg/dL (ref 8–23)
CO2: 26 mmol/L (ref 22–32)
Calcium: 8.9 mg/dL (ref 8.9–10.3)
Chloride: 112 mmol/L — ABNORMAL HIGH (ref 98–111)
Creatinine, Ser: 1.3 mg/dL — ABNORMAL HIGH (ref 0.61–1.24)
GFR calc Af Amer: 56 mL/min — ABNORMAL LOW (ref >60)
GFR calc non Af Amer: 49 mL/min — ABNORMAL LOW (ref >60)
Glucose, Bld: 90 mg/dL (ref 70–99)
Potassium: 4 mmol/L (ref 3.5–5.1)
Sodium: 146 mmol/L — ABNORMAL HIGH (ref 135–145)

## 2018-10-12 LAB — CBC
HCT: 38.8 % — ABNORMAL LOW (ref 39.0–52.0)
Hemoglobin: 12.3 g/dL — ABNORMAL LOW (ref 13.0–17.0)
MCH: 31.3 pg (ref 26.0–34.0)
MCHC: 31.7 g/dL (ref 30.0–36.0)
MCV: 98.7 fL (ref 80.0–100.0)
Platelets: 153 10*3/uL (ref 150–400)
RBC: 3.93 MIL/uL — ABNORMAL LOW (ref 4.22–5.81)
RDW: 12.3 % (ref 11.5–15.5)
WBC: 9.3 10*3/uL (ref 4.0–10.5)
nRBC: 0 % (ref 0.0–0.2)

## 2018-10-12 LAB — SARS CORONAVIRUS 2 (TAT 6-24 HRS): SARS Coronavirus 2: NEGATIVE

## 2018-10-12 MED ORDER — DIVALPROEX SODIUM 125 MG PO CSDR
125.0000 mg | DELAYED_RELEASE_CAPSULE | Freq: Two times a day (BID) | ORAL | Status: DC
  Administered 2018-10-13 (×2): 125 mg via ORAL
  Filled 2018-10-12 (×7): qty 1

## 2018-10-12 MED ORDER — DONEPEZIL HCL 5 MG PO TABS
10.0000 mg | ORAL_TABLET | Freq: Every day | ORAL | Status: DC
  Administered 2018-10-12: 10 mg via ORAL
  Filled 2018-10-12 (×4): qty 2

## 2018-10-12 MED ORDER — QUETIAPINE FUMARATE 25 MG PO TABS
200.0000 mg | ORAL_TABLET | Freq: Every day | ORAL | Status: DC
  Administered 2018-10-12: 200 mg via ORAL
  Filled 2018-10-12 (×2): qty 8

## 2018-10-12 MED ORDER — DIVALPROEX SODIUM 125 MG PO CSDR
250.0000 mg | DELAYED_RELEASE_CAPSULE | Freq: Every evening | ORAL | Status: DC
  Administered 2018-10-13: 250 mg via ORAL
  Filled 2018-10-12 (×4): qty 2

## 2018-10-12 MED ORDER — RISPERIDONE 1 MG PO TABS
1.0000 mg | ORAL_TABLET | Freq: Every day | ORAL | Status: DC
  Administered 2018-10-13: 1 mg via ORAL
  Filled 2018-10-12 (×4): qty 1

## 2018-10-12 NOTE — Plan of Care (Signed)
  Problem: Education: Goal: Knowledge of General Education information will improve Description: Including pain rating scale, medication(s)/side effects and non-pharmacologic comfort measures Outcome: Not Progressing   Problem: Health Behavior/Discharge Planning: Goal: Ability to manage health-related needs will improve Outcome: Not Progressing   Problem: Safety: Goal: Ability to remain free from injury will improve Outcome: Progressing   

## 2018-10-12 NOTE — Progress Notes (Signed)
[place] at [place] NAME: Garald Rhew    MR#:  [id number]  DATE OF BIRTH:  10-18-1927  SUBJECTIVE:   Patient came in with increasing confusion. He has advanced dementia per RN pulled  IV out. Calm at present REVIEW OF SYSTEMS:   Review of Systems  Unable to perform ROS: Dementia   Tolerating Diet:yes Tolerating PT: pending  DRUG ALLERGIES:  No Known Allergies  VITALS:  Blood pressure 128/68, pulse 67, temperature 98.2 F (36.8 C), temperature source Oral, resp. rate 16, height 5\' 11"  (1.803 m), weight 71.9 kg, SpO2 99 %.  PHYSICAL EXAMINATION:   Physical Exam  GENERAL:  85 y.o.-year-old patient lying in the bed with no acute distress. weak EYES: Pupils equal, round, reactive to light and accommodation. No scleral icterus.  HEENT: Head atraumatic, normocephalic. Oropharynx and nasopharynx clear.  NECK:  Supple, no jugular venous distention. No thyroid enlargement, no tenderness.  LUNGS: Normal breath sounds bilaterally, no wheezing, rales, rhonchi. No use of accessory muscles of respiration.  CARDIOVASCULAR: S1, S2 normal. No murmurs, rubs, or gallops.  ABDOMEN: Soft, nontender, nondistended. Bowel sounds present. No organomegaly or mass.  EXTREMITIES: No cyanosis, clubbing or edema b/l.    NEUROLOGIC: Cranial nerves II through XII are intact. No focal Motor or sensory deficits b/l.  Grossly non-focal  PSYCHIATRIC:  patient is alert and awake--has dementia SKIN: No obvious rash, lesion, or ulcer.   LABORATORY PANEL:  CBC Recent Labs  Lab 10/12/18 0523  WBC 9.3  HGB 12.3*  HCT 38.8*  PLT 153    Chemistries  Recent Labs  Lab 10/11/18 1055 10/12/18 0523  NA 145 146*  K 4.2 4.0  CL 108 112*  CO2 27 26  GLUCOSE 99 90  BUN 27* 21  CREATININE 1.65* 1.30*  CALCIUM 9.3 8.9  AST 148*  --   ALT 24  --   ALKPHOS 66  --   BILITOT 0.9  --    Cardiac Enzymes No results for input(s): TROPONINI in the last 168  hours. RADIOLOGY:  Dg Chest 2 View  Result Date: 10/11/2018 CLINICAL DATA:  Altered mental status EXAM: CHEST - 2 VIEW COMPARISON:  July 24, 2016 FINDINGS: There is stable elevation of the left hemidiaphragm with adjacent atelectasis. There is no pneumothorax. No large pleural effusion. No focal infiltrate. The heart size is stable. Aortic calcifications are noted. There is no acute osseous abnormality. IMPRESSION: No active cardiopulmonary disease. Electronically Signed   By: Constance Holster M.D.   On: 10/11/2018 11:17   Ct Head Wo Contrast  Result Date: 10/11/2018 CLINICAL DATA:  Altered mental status. Encephalopathy. EXAM: CT HEAD WITHOUT CONTRAST TECHNIQUE: Contiguous axial images were obtained from the base of the skull through the vertex without intravenous contrast. COMPARISON:  CT scan dated 07/23/2016 FINDINGS: Brain: No evidence of acute infarction, hemorrhage, hydrocephalus, extra-axial collection or mass lesion/mass effect. There is diffuse chronic cerebral cortical and cerebellar atrophy. The atrophy is most prominent in the temporal lobes. There is chronic extensive periventricular white matter lucency, increased since the prior study consistent with chronic small vessel ischemic disease. Vascular: No hyperdense vessel or unexpected calcification. Skull: Normal. Negative for fracture or focal lesion. Sinuses/Orbits: Normal. Other: None IMPRESSION: 1. No acute intracranial abnormality. Diffuse chronic small vessel ischemic disease, increased since the prior study. 2. Progressive atrophy, most prominent in the temporal lobes. Electronically Signed   By: Lorriane Shire M.D.   On: 10/11/2018 13:32   US  Abdomen Limited Ruq  Result Date: 10/11/2018 CLINICAL DATA:  85 year old male with right upper quadrant abdominal pain. EXAM: ULTRASOUND ABDOMEN LIMITED RIGHT UPPER QUADRANT COMPARISON:  CT of the abdomen pelvis dated 03/02/2015 FINDINGS: Gallbladder: No gallstones or wall thickening  visualized. No sonographic Murphy sign noted by sonographer. Common bile duct: Diameter: 4 mm Liver: Mild coarsened liver echotexture. Evaluation of the liver is limited due to overlying bowel gas. Portal vein is patent on color Doppler imaging with normal direction of blood flow towards the liver. Other: Irregularity of the right renal contour consistent with areas of infarct. IMPRESSION: Unremarkable right upper quadrant ultrasound. Electronically Signed   By: Elgie CollardArash  Radparvar M.D.   On: 10/11/2018 14:46   ASSESSMENT AND PLAN:    Ozzie HoyleJames Bultema  is a 85 y.o. male with a known history of dementia and lives in a nursing facility.  At baseline he is independent in day-to-day activities and talking.  Today the facility found him altered mental status and drowsy and not talking so sent to emergency room.  As per the facility report to given to the EMS and nurse he was having low-grade fever and tachycardia. Patient is confused and cannot give me much detail.  *Altered mental status Likely dehydration with underlying progressive dementia. -COVID-19 test result Negative -Cont IV fluids. -Patient has baseline dementia but he is able to communicate  Some -CT scan head is negative for acute findings. -Patient has no fever. So far he is calm and composed  *Alzheimer's dementia - resume his home meds -likely progressive dementia. Will get palliative care consultation.  *Acute on chronic kidney disease stage III-- mild dehydration Appears to be at baseline, avoid nephrotoxic medication and keep on IV fluids and monitor. -Improving creatinine with IV fluids continue till PO intake improves  *Speech therapy to see patient for swallow eval. Recommends honey thick liquid  [facility]. Social worker discharge planning. Will try to get in touch with wife unable to do so far  Case discussed with Care Management/Social Worker.   CODE STATUS: full DVT Prophylaxis: Heparin  TOTAL TIME  TAKING CARE OF THIS PATIENT: *35* minutes.  >50% time spent on counselling and coordination of care  POSSIBLE D/C IN *1-2* DAYS, DEPENDING ON CLINICAL CONDITION.  Note: This dictation was prepared with Dragon dictation along with smaller phrase technology. Any transcriptional errors that result from this process are unintentional.  Enedina FinnerSona Amethyst Gainer M.D on 10/12/2018 at 1:44 PM  Between 7am to 6pm - Pager - [phone]  After 6pm go to [email or web address] - Social research officer, governmentpassword [email or web address]  [facility] [place] [facility]  Office  [phone]  CC: Primary care physician; [facility] ID: Magdalene PatriciaJames C Waymire, male   DOB: 09/13/1927, 85 y.o.   MRN: [id number]

## 2018-10-12 NOTE — Evaluation (Signed)
Clinical/Bedside Swallow Evaluation Patient Details  Name: Joel PatriciaJames C Kluver MRN: [id number] Date of Birth: 01-Apr-1927  Today's Date: 10/12/2018 Time: SLP Start Time (ACUTE ONLY): 0930 SLP Stop Time (ACUTE ONLY): 1025 SLP Time Calculation (min) (ACUTE ONLY): 55 min  Past Medical History:  Past Medical History:  Diagnosis Date  . Dementia [facility](HCC)    Past Surgical History: History reviewed. No pertinent surgical history. HPI:  Pt is a 85 y.o. male with history of advanced dementia here with reported altered mental status.  Patient reportedly is normally more awake and alert than usual.  He was found this morning and was significant more confused.  Pt was admitted w/ Dehydration.    Assessment / Plan / Recommendation Clinical Impression  Pt appears to present w/ Moderate-Severe oropharyngeal phase dysphagia w/ significant impact on his overall swallow presentation from the advanced Dementia - pt is at increased risk for aspiration thus Pulmonary impact/decline. Pt required mod-max verbal/tactile/visual cues in order to attend/follow through w/ po tasks d/t his decreased awareness overall for the task of swallowing. Pt exhibited fairly consistent oral holding, delayed A-P tranfer, increased munching time on trials of increased texture(applesauce), and overt s/s of aspiration (coughing, throat clearing) suspect related to the prolonged oral phase time. With trials of Honey consistency liquids(increased texture for awareness), and trials of puree, pt exhibited continued lengthy oral phase time/holding but no overt s/s of aspiration or decline in respiratory status during/post trials. Mod-max cues given during trials; full feeding support. Of note, during trials of ice chips, coughing followed x2/3 trials. Recommend initiation of a Dysphagia level 1 (puree) diet w/ Honey consistency liquids by TSP; strict aspiration precautions including MUST CHECK FOR ORAL CLEARING; full feeding support at meals. Recommend  pills Crushed in puree; IV support for hydration at this time.  SLP Visit Diagnosis: Dysphagia, oropharyngeal phase (R13.12)(advanced Dementia)    Aspiration Risk  Moderate aspiration risk;Risk for inadequate nutrition/hydration    Diet Recommendation  Dysphagia level 1 (puree) w/ HONEY consistency liquids via TSP currently; MUST check for oral clearing w/ all oral intake; aspiration precautions; full feeding support  Medication Administration: Crushed with puree(for safer swallowing)    Other  Recommendations Recommended Consults: (Dietician and Palliative Care for GOC f/u) Oral Care Recommendations: Oral care BID;Staff/trained caregiver to provide oral care Other Recommendations: Order thickener from pharmacy;Prohibited food (jello, ice cream, thin soups);Remove water pitcher;Have oral suction available   Follow up Recommendations Skilled Nursing facility(TBD)      Frequency and Duration min 3x week  2 weeks       Prognosis Prognosis for Safe Diet Advancement: Guarded Barriers to Reach Goals: Cognitive deficits;Time post onset;Severity of deficits      Swallow Study   General Date of Onset: 10/11/18 HPI: Pt is a 85 y.o. male with history of advanced dementia here with reported altered mental status.  Patient reportedly is normally more awake and alert than usual.  He was found this morning and was significant more confused.  Pt was admitted w/ Dehydration.  Type of Study: Bedside Swallow Evaluation Previous Swallow Assessment: none reported Diet Prior to this Study: NPO(unsure) Temperature Spikes Noted: No(wbc 9.3) Respiratory Status: Nasal cannula(2L) History of Recent Intubation: No Behavior/Cognition: Cooperative;Confused;Requires cueing;Doesn't follow directions(Eyes closed most often) Oral Cavity Assessment: Dry(difficult to assess d/t cognition) Oral Care Completed by SLP: Recent completion by staff Oral Cavity - Dentition: Adequate natural dentition(difficult to assess  d/t cognition) Vision: (n/a) Self-Feeding Abilities: Total assist Patient Positioning: Upright in bed(needed positioning) Baseline Vocal Quality: (  mumbled speech) Volitional Cough: Cognitively unable to elicit Volitional Swallow: Unable to elicit    Oral/Motor/Sensory Function Overall Oral Motor/Sensory Function: (no unilateral weaknss; posterior tongue strength; tremors)   Ice Chips Ice chips: Impaired Presentation: Spoon(fed; 3 trials) Oral Phase Impairments: Poor awareness of bolus Oral Phase Functional Implications: Oral holding Pharyngeal Phase Impairments: Cough - Delayed;Suspected delayed Swallow(x2/3)   Thin Liquid Thin Liquid: Not tested    Nectar Thick Nectar Thick Liquid: Impaired Presentation: Spoon(fed; 4 trials) Oral Phase Impairments: Poor awareness of bolus Oral phase functional implications: Prolonged oral transit;Oral holding Pharyngeal Phase Impairments: Throat Clearing - Delayed(intermittent)   Honey Thick Honey Thick Liquid: Impaired Presentation: Spoon(fed; 8 trials) Oral Phase Impairments: Poor awareness of bolus Oral Phase Functional Implications: Prolonged oral transit;Oral holding Pharyngeal Phase Impairments: (no overt coughing or throat clearing)   Puree Puree: Impaired Presentation: Spoon(fed; 8 trials) Oral Phase Impairments: Poor awareness of bolus Oral Phase Functional Implications: Prolonged oral transit;Oral holding Pharyngeal Phase Impairments: (no overt coughing or throat clearing)   Solid     Solid: Not tested       Orinda Kenner, MS, CCC-SLP [place] 10/12/2018,11:49 AM

## 2018-10-13 DIAGNOSIS — Z7189 Other specified counseling: Secondary | ICD-10-CM

## 2018-10-13 DIAGNOSIS — Z515 Encounter for palliative care: Secondary | ICD-10-CM

## 2018-10-13 MED ORDER — SENNOSIDES-DOCUSATE SODIUM 8.6-50 MG PO TABS
1.0000 | ORAL_TABLET | Freq: Two times a day (BID) | ORAL | Status: DC
  Administered 2018-10-13: 1 via ORAL
  Filled 2018-10-13 (×2): qty 1

## 2018-10-13 NOTE — Progress Notes (Signed)
[facility] NAME: Joel Mcconnell    MR#:  [id number]  DATE OF BIRTH:  1927/11/16  SUBJECTIVE:   Patient came in with increasing confusion. He has advanced dementia resting quietly. Eating some. Remains confused at baseline REVIEW OF SYSTEMS:   Review of Systems  Unable to perform ROS: Dementia   Tolerating Diet:yes Tolerating PT: SNF  DRUG ALLERGIES:  No Known Allergies  VITALS:  Blood pressure 134/69, pulse 64, temperature 98.9 F (37.2 C), temperature source Oral, resp. rate 16, height 5\' 11"  (1.803 m), weight 71.9 kg, SpO2 95 %.  PHYSICAL EXAMINATION:   Physical Exam  GENERAL:  85 y.o.-year-old patient lying in the bed with no acute distress. weak EYES: Pupils equal, round, reactive to light and accommodation. No scleral icterus.  HEENT: Head atraumatic, normocephalic. Oropharynx and nasopharynx clear.  NECK:  Supple, no jugular venous distention. No thyroid enlargement, no tenderness.  LUNGS: Normal breath sounds bilaterally, no wheezing, rales, rhonchi. No use of accessory muscles of respiration.  CARDIOVASCULAR: S1, S2 normal. No murmurs, rubs, or gallops.  ABDOMEN: Soft, nontender, nondistended. Bowel sounds present. No organomegaly or mass.  EXTREMITIES: No cyanosis, clubbing or edema b/l.    NEUROLOGIC: Cranial nerves II through XII are intact. No focal Motor or sensory deficits b/l.  Grossly non-focal  PSYCHIATRIC:  patient is alert and awake--has dementia SKIN: No obvious rash, lesion, or ulcer.   LABORATORY PANEL:  CBC Recent Labs  Lab 10/12/18 0523  WBC 9.3  HGB 12.3*  HCT 38.8*  PLT 153    Chemistries  Recent Labs  Lab 10/11/18 1055 10/12/18 0523  NA 145 146*  K 4.2 4.0  CL 108 112*  CO2 27 26  GLUCOSE 99 90  BUN 27* 21  CREATININE 1.65* 1.30*  CALCIUM 9.3 8.9  AST 148*  --   ALT 24  --   ALKPHOS 66  --   BILITOT 0.9  --    Cardiac Enzymes No results for input(s): TROPONINI  in the last 168 hours. RADIOLOGY:  No results found. ASSESSMENT AND PLAN:    Joel Mcconnell  is a 85 y.o. male with a known history of dementia and lives in a nursing facility.  At baseline he is independent in day-to-day activities and talking.  Today the facility found him altered mental status and drowsy and not talking so sent to emergency room.  As per the facility report to given to the EMS and nurse he was having low-grade fever and tachycardia. Patient is confused and cannot give me much detail.  *Altered mental status Likely dehydration with underlying progressive dementia. -COVID-19 test result Negative -Cont IV fluids. -Patient has baseline dementia but he is able to communicate  Some -CT scan head is negative for acute findings. -Patient has no fever. So far he is calm and composed  *Alzheimer's dementia - resume his home meds -likely progressive dementia. Will get palliative care consultation.  *Acute on chronic kidney disease stage III-- mild dehydration Appears to be at baseline, avoid nephrotoxic medication and keep on IV fluids and monitor. -Improving creatinine with IV fluids continue till PO intake improves  *Speech therapy to see patient for swallow eval. Recommends honey thick liquid  Patient is from [facility]. Social worker discharge planning. Physical therapy record recommends rehab. Patient has a legal guardian. Social worker has informed and updated legal guardian. She is in agreement so far with the plan. Discharge planning to rehab once bed  available Case discussed with Care Management/Social Worker.   CODE STATUS: full DVT Prophylaxis: Heparin  TOTAL TIME TAKING CARE OF THIS PATIENT: *25* minutes.  >50% time spent on counselling and coordination of care  POSSIBLE D/C IN *1-2* DAYS, DEPENDING ON CLINICAL CONDITION.  Note: This dictation was prepared with Dragon dictation along with smaller phrase technology. Any transcriptional errors that result  from this process are unintentional.  Enedina FinnerSona Kauri Garson M.D on 10/13/2018 at 2:41 PM  Between 7am to 6pm - Pager - [phone]  After 6pm go to [email or web address] - Social research officer, governmentpassword EPAS [facility]  [facility]  Office  [phone]  CC: Primary care physician; [facility] ID: Joel Mcconnell, male   DOB: Sep 08, 1927, 85 y.o.   MRN: [id number]

## 2018-10-13 NOTE — Consult Note (Addendum)
Consultation Note Date: 10/13/2018   Patient Name: Joel Mcconnell  DOB: 1927/05/20  MRN: [id number]  Age / Sex: 85 y.o., male  PCP: [facility] Referring Physician: Enedina Finner, MD  Reason for Consultation: Establishing goals of care  HPI/Patient Profile: Joel Mcconnell  is a 85 y.o. male with a known history of dementia and lives in a nursing facility.  At baseline he is independent in day-to-day activities and talking.  Today the facility found him altered mental status and drowsy and not talking so sent to emergency room.  Clinical Assessment and Goals of Care: Patient is resting in bed. He is very confused. With every question asked, he states "yea, I think so too", or "yea, I agree."  Patient is a ward of the state. His wife is listed as emergency contact. Spoke with Kylie Marrow Marlyne Beards, she states palliative providers can speak with family members about patient status and goals of care, but they of course will make the final decision.   Kylie and I discussed his status in depth. We discussed his dehydration and now, his swallow precautions. High risk for failure to thrive. Discussed GOC. DNR/DNI, no feeding tube. She will send a rep to pick up the completed MOST form over the weekend.   Spoke with wife and her son Tammy Sours. They state she is his second marriage. He has 2 children which he has not seen in years. They discuss his dementia symptoms beginning in 2016. Prior to his admission to a facility he had gotten lost while driving. He had an admission to geripsych due to his behavior at a facility.  DSS became involved as legal guardian. They discuss an attorney who is his estate guardian. They discuss his last few years. Up until covid, he was able to speak and do for himself, he just had issues with memory. We discussed the recommendations for care moving forward. They understand and agree with what  was discussed with DSS.   DSS worker Carly came to pick up the MOST form. Reviewed his diagnosis and prognosis. Her questions were answered in detail. Explained that he came in with dehydration, and now has diet restrictions. Plans for rehab, but may not do well with restrictions, if not,  recommend transition to hospice level care. DSS worker to take MOST form back to DSS for internal meeting/ review, and final signature by their team.       [place] DSS is his guardian.     SUMMARY OF RECOMMENDATIONS    Recommend palliative to follow at rehab. Recommend transition to hospice if he does not do well with diet.   Prognosis:   Poor overall  Discharge Planning: Skilled Nursing Facility for rehab with Palliative care service follow-up      Primary Diagnoses: Present on Admission:  AMS (altered mental status)   I have reviewed the medical record, interviewed the patient and family, and examined the patient. The following aspects are pertinent.  Past Medical History:  Diagnosis Date   Dementia (HCC)  Social History   Socioeconomic History   Marital status: Married    Spouse name: Not on file   Number of children: Not on file   Years of education: Not on file   Highest education level: Not on file  Occupational History   Not on file  Social Needs   Financial resource strain: Not on file   Food insecurity    Worry: Not on file    Inability: Not on file   Transportation needs    Medical: Not on file    Non-medical: Not on file  Tobacco Use   Smoking status: Never Smoker   Smokeless tobacco: Never Used  Substance and Sexual Activity   Alcohol use: No   Drug use: No   Sexual activity: Not on file  Lifestyle   Physical activity    Days per week: Not on file    Minutes per session: Not on file   Stress: Not on file  Relationships   Social connections    Talks on phone: Not on file    Gets together: Not on file    Attends religious service: Not on  file    Active member of club or organization: Not on file    Attends meetings of clubs or organizations: Not on file    Relationship status: Not on file  Other Topics Concern   Not on file  Social History Narrative   Not on file   No family history on file. Scheduled Meds:  amLODipine  10 mg Oral Daily   divalproex  125 mg Oral BID WC   divalproex  250 mg Oral QPM   donepezil  10 mg Oral QHS   heparin  5,000 Units Subcutaneous Q8H   QUEtiapine  200 mg Oral QHS   risperiDONE  1 mg Oral Q1400   senna-docusate  1 tablet Oral BID   Continuous Infusions:  sodium chloride 75 mL/hr at 10/13/18 0948   PRN Meds:.acetaminophen, magnesium hydroxide Medications Prior to Admission:  Prior to Admission medications   Medication Sig Start Date End Date Taking? Authorizing Provider  acetaminophen (TYLENOL) 500 MG tablet Take 500-1,000 mg by mouth every 6 (six) hours as needed for mild pain or fever.   Yes [provider]  alum & mag hydroxide-simeth (MINTOX) 200-200-20 MG/5ML suspension Take 30 mLs by mouth daily as needed for indigestion or heartburn.   Yes [provider]  cetirizine (ZYRTEC) 10 MG tablet Take 10 mg by mouth daily as needed for allergies (anxiety).    Yes [provider]  divalproex (DEPAKOTE) 125 MG DR tablet Take 125-250 mg by mouth See admin instructions. Take 1 tablet (125mg ) by mouth daily at 8AM and 12PM and take 2 tablets (250mg ) by mouth every night at [facility] 09/01/18  Yes [provider]  donepezil (ARICEPT) 10 MG tablet Take 10 mg by mouth at bedtime. 09/01/18  Yes [provider]  loperamide (IMODIUM) 2 MG capsule Take 2 mg by mouth as needed for diarrhea or loose stools.   Yes [provider]  magnesium hydroxide (MILK OF MAGNESIA) 400 MG/5ML suspension Take 30 mLs by mouth daily as needed for mild constipation or moderate constipation.   Yes [provider]  QUEtiapine (SEROQUEL) 200 MG tablet Take  200 mg by mouth at bedtime. 08/25/18  Yes [provider]  risperiDONE (RISPERDAL) 1 MG tablet Take 1 mg by mouth daily at 2 PM. 10/10/18  Yes [provider]   No Known Allergies Review  of Systems  Unable to perform ROS   Physical Exam Pulmonary:     Effort: Pulmonary effort is normal.  Neurological:     Comments: Confused.      Vital Signs: BP 140/65 (BP Location: Left Arm)    Pulse 62    Temp 98.5 F (36.9 C) (Oral)    Resp 16    Ht 5\' 11"  (1.803 m)    Wt 71.9 kg    SpO2 100%    BMI 22.11 kg/m  Pain Scale: Faces   Pain Score: 0-No pain   SpO2: SpO2: 100 % O2 Device:SpO2: 100 % O2 Flow Rate: .O2 Flow Rate (L/min): 1 L/min  IO: Intake/output summary:   Intake/Output Summary (Last 24 hours) at 10/13/2018 1159 Last data filed at 10/13/2018 0700 Gross per 24 hour  Intake 1896.24 ml  Output 750 ml  Net 1146.24 ml    LBM: Last BM Date: (Unkpwn) Baseline Weight: Weight: 75 kg Most recent weight: Weight: 71.9 kg     Palliative Assessment/Data: 30%     Time In: 11:30 Time Out: 3:00 Time Total: 3.5 hours Greater than 50%  of this time was spent counseling and coordinating care related to the above assessment and plan.  Signed by: Morton Stallrystal Zelpha Messing, NP   Please contact Palliative Medicine Team phone at [phone] for questions and concerns.  For individual provider: See Loretha StaplerAmion

## 2018-10-13 NOTE — TOC Initial Note (Signed)
Transition of Care [facility]) - Initial/Assessment Note    Patient Details  Name: Joel Mcconnell MRN: [id number] Date of Birth: 02-Jul-1927  [facility] CM/SW Contact:    Beverly Sessions, RN Phone Number: 10/13/2018, 3:57 PM  Clinical Narrative:                 Patient admitted from [place]  Per [place] house patient is able to ambulate at baseline  Patient has legal guardian.  Assigned social worker is Pharmacist, hospital, social worker:  [phone]   PT has assessed patient and recommends SNF. RNCM spoke with med tech Necy at [facility] who discussed the case with the Counsellor.  At this time given patient's current conditions they are unable to accept him back.  She states if he improves prior to discharge to contact them again.  Bethann [facility] is aware that [facility] will not accept back, and agreeable to bed search.  Existing PASRR, Fl2 sent for signature, bed search initiated.   Patient will require 3 night medically necessary stay in order to qualify for SNF.  Palliative team has met with guardian to discuss goals of care and most form.   Outpatient palliative referral made to Santiago Glad with [facility] Expected Discharge Plan: [place]     Patient Goals and CMS Choice        Expected Discharge Plan and Services Expected Discharge Plan: [place] arrangements for the past 2 months: [place]                                      Prior Living Arrangements/Services Living arrangements for the past 2 months: [place] Lives with:: Facility Resident                   Activities of Daily Living      Permission Sought/Granted                  Emotional Assessment       Orientation: : Oriented to Self   Psych Involvement: No (comment)  Admission diagnosis:  RUQ pain [R10.11] Patient Active Problem List   Diagnosis Date Noted  . AMS  (altered mental status) 10/12/2018  . Altered mental status 10/11/2018  . Dementia with behavioral problem ([place]) 08/05/2016  . Hemoperitoneum 03/01/2015  . Abdominal hematoma   . Abdominal pain   . ANXIETY 09/14/2006  . ERECTILE DYSFUNCTION 09/14/2006  . BENIGN POSITIONAL VERTIGO 09/14/2006  . ALLERGIC RHINITIS 09/14/2006  . SPONDYLOSIS 09/14/2006  . SPINAL STENOSIS 09/14/2006  . COLONIC POLYPS, HX OF 09/14/2006   PCP:  [place] [facility]:  No Pharmacies Listed    Social Determinants of Health (SDOH) Interventions    Readmission Risk Interventions No flowsheet data found.

## 2018-10-13 NOTE — NC FL2 (Signed)
[place] LEVEL OF CARE SCREENING TOOL     IDENTIFICATION  Patient Name: Joel Mcconnell Birthdate: August 17, 1927 Sex: male Admission Date (Current Location): 10/11/2018  [facility] [place] Number:  Engineering geologist and Address:         Provider Number: [id number]  Attending Physician Name and Address:  Fritzi Mandes, MD  Relative Name and Phone Number:       Current Level of Care: Hospital Recommended Level of Care: [place] Prior Approval Number:    Date Approved/Denied:   PASRR Number: [id number]  Discharge Plan: SNF    Current Diagnoses: Patient Active Problem List   Diagnosis Date Noted  . AMS (altered mental status) 10/12/2018  . Altered mental status 10/11/2018  . Dementia with behavioral problem ([place]) 08/05/2016  . Hemoperitoneum 03/01/2015  . Abdominal hematoma   . Abdominal pain   . ANXIETY 09/14/2006  . ERECTILE DYSFUNCTION 09/14/2006  . BENIGN POSITIONAL VERTIGO 09/14/2006  . ALLERGIC RHINITIS 09/14/2006  . SPONDYLOSIS 09/14/2006  . SPINAL STENOSIS 09/14/2006  . COLONIC POLYPS, HX OF 09/14/2006    Orientation RESPIRATION BLADDER Height & Weight     Self  Normal Incontinent, External catheter Weight: 71.9 kg Height:  5\' 11"  (180.3 cm)  BEHAVIORAL SYMPTOMS/MOOD NEUROLOGICAL BOWEL NUTRITION STATUS      Continent Diet(dysphagia 1)  AMBULATORY STATUS COMMUNICATION OF NEEDS Skin   Extensive Assist Verbally Normal                       Personal Care Assistance Level of Assistance  Bathing, Feeding, Dressing Bathing Assistance: Maximum assistance Feeding assistance: Limited assistance Dressing Assistance: Maximum assistance     Functional Limitations Info  Hearing   Hearing Info: Impaired      SPECIAL CARE FACTORS FREQUENCY  PT (By licensed PT), OT (By licensed OT)                    Contractures Contractures Info: Not present    Additional Factors Info  Code Status, Allergies Code Status  Info: Full Allergies Info: NKDA           Current Medications (10/13/2018):  This is the current hospital active medication list Current Facility-Administered Medications  Medication Dose Route Frequency Provider Last Rate Last Dose  . 0.9 %  sodium chloride infusion   Intravenous Continuous Vaughan Basta, MD 75 mL/hr at 10/13/18 0948    . acetaminophen (TYLENOL) tablet 500-1,000 mg  500-1,000 mg Oral Q6H PRN Vaughan Basta, MD      . amLODipine (NORVASC) tablet 10 mg  10 mg Oral Daily Vaughan Basta, MD   10 mg at 10/13/18 0943  . divalproex (DEPAKOTE SPRINKLE) capsule 125 mg  125 mg Oral BID WC Nazari, Walid A, RPH   125 mg at 10/13/18 1247  . divalproex (DEPAKOTE SPRINKLE) capsule 250 mg  250 mg Oral QPM Nazari, Walid A, RPH      . donepezil (ARICEPT) tablet 10 mg  10 mg Oral QHS Fritzi Mandes, MD   10 mg at 10/12/18 2220  . heparin injection 5,000 Units  5,000 Units Subcutaneous Q8H Vaughan Basta, MD   5,000 Units at 10/13/18 0545  . magnesium hydroxide (MILK OF MAGNESIA) suspension 30 mL  30 mL Oral Daily PRN Vaughan Basta, MD      . QUEtiapine (SEROQUEL) tablet 200 mg  200 mg Oral QHS Fritzi Mandes, MD   200 mg at 10/12/18 2220  . risperiDONE (RISPERDAL) tablet  1 mg  1 mg Oral Q1400 Enedina FinnerPatel, Sona, MD      . senna-docusate (Senokot-S) tablet 1 tablet  1 tablet Oral BID Enedina FinnerPatel, Sona, MD         Discharge Medications: Please see discharge summary for a list of discharge medications.  Relevant Imaging Results:  Relevant Lab Results:   Additional Information ss [id number]  Chapman FitchBOWEN, Wilder Kurowski T, RN

## 2018-10-13 NOTE — Progress Notes (Signed)
Notified in report by Anabella, RN that patients legal guardian brought in signed MOST form designating patient as DNR but that MD signature is still needed. Call placed to Dr Jannifer Franklin. Notified of the above and DNR order received.

## 2018-10-13 NOTE — Progress Notes (Signed)
New referral for Palliative to follow post discharge received from [place]. Discharge location not decided at this time. Patient information given to referral. Flo Shanks BSN, [place] [phone]

## 2018-10-13 NOTE — Evaluation (Signed)
Physical Therapy Evaluation Patient Details Name: Joel Mcconnell MRN: [id number] DOB: 1927-07-16 Today's Date: 10/13/2018   History of Present Illness  85 y.o. male with a known history of dementia and lives in a nursing facility [facility]  Apparently at baseline he is independent in day-to-day activities and talking.  Now with altered mental status and drowsy and not talking.    Clinical Impression  Pt was very limited with ability to participate with PT exam.  He did not display any ability to hold conversation and displayed very poor situational awareness.  Pt needed heavy assist with all mobility and though at times he appeared to comprehend that we were trying to move he still showed little to no ability to actively participate with PT.  Struggled to maintain sitting balance and completely unable to attain standing w/o heavy assist.    Follow Up Recommendations SNF;Supervision/Assistance - 24 hour    Equipment Recommendations       Recommendations for Other Services       Precautions / Restrictions Precautions Precautions: Fall Restrictions Weight Bearing Restrictions: No      Mobility  Bed Mobility Overal bed mobility: Needs Assistance Bed Mobility: Supine to Sit;Sit to Supine     Supine to sit: Max assist Sit to supine: Max assist   General bed mobility comments: Pt did not initiate getting to sitting despite multiple different tactics trying to encourage him.  Ultimately needed heavy assist to get up, leaning backward at EOB struggling to keep weight forward   Transfers Overall transfer level: Needs assistance Equipment used: Rolling walker (2 wheeled) Transfers: Sit to/from Stand Sit to Stand: Max assist         General transfer comment: Again pt could not initiate movement despite multiple cues, we stood up two seperate times however each was very PT driven and pt was unable to attain fully upright standing despite very heavy assist.  Pt followed zero  instructions for adjusting position/walker use/etc  Ambulation/Gait                Stairs            Wheelchair Mobility    Modified Rankin (Stroke Patients Only)       Balance Overall balance assessment: Needs assistance   Sitting balance-Leahy Scale: Poor Sitting balance - Comments: Pt needing constant guarding and frequent phyiscal assist to maintain sitting EOB Postural control: Posterior lean Standing balance support: Bilateral upper extremity supported Standing balance-Leahy Scale: Zero Standing balance comment: Pt requiring heavy assist the entire time in standing to keep from falling back                             Pertinent Vitals/Pain Pain Assessment: (unable to answer/rate, does not indicate pain)    Home Living Family/patient expects to be discharged to:: Unsure                 Additional Comments: Pt from nursing home    Prior Function Level of Independence: Independent with assistive device(s)         Comments: per notes pt is [facility] and [facility] despite his dementia     Hand Dominance        Extremity/Trunk Assessment   Upper Extremity Assessment Upper Extremity Assessment: Difficult to assess due to impaired cognition    Lower Extremity Assessment Lower Extremity Assessment: Difficult to assess due to impaired cognition  Communication   Communication: Expressive difficulties  Cognition Arousal/Alertness: Awake/alert Behavior During Therapy: Flat affect Overall Cognitive Status: Impaired/Different from baseline(acute AMS on chronic dementia) Area of Impairment: Following commands;Awareness                               General Comments: Pt with inability to state name, birthday or have even very basic conversation.  Did occasionally ramble incoherently but with no effective communication      General Comments      Exercises     Assessment/Plan     PT Assessment Patient needs continued PT services  PT Problem List Decreased strength       PT Treatment Interventions DME instruction;Gait training;Stair training;Therapeutic activities;Functional mobility training;Therapeutic exercise;Balance training;Cognitive remediation;Patient/family education    PT Goals (Current goals can be found in the Care Plan section)  Acute Rehab PT Goals Patient Stated Goal: Pt unable to respond PT Goal Formulation: Patient unable to participate in goal setting Time For Goal Achievement: 10/27/18 Potential to Achieve Goals: Poor    Frequency Min 2X/week   Barriers to discharge        Co-evaluation               AM-PAC PT "6 Clicks" Mobility  Outcome Measure Help needed turning from your back to your side while in a flat bed without using bedrails?: Total Help needed moving from lying on your back to sitting on the side of a flat bed without using bedrails?: Total Help needed moving to and from a bed to a chair (including a wheelchair)?: Total Help needed standing up from a chair using your arms (e.g., wheelchair or bedside chair)?: Total Help needed to walk in hospital room?: Total Help needed climbing 3-5 steps with a railing? : Total 6 Click Score: 6    End of Session Equipment Utilized During Treatment: Gait belt Activity Tolerance: Patient tolerated treatment well Patient left: with bed alarm set;with call bell/phone within reach Nurse Communication: Mobility status PT Visit Diagnosis: Muscle weakness (generalized) (M62.81);Difficulty in walking, not elsewhere classified (R26.2)    Time: 1610-9604 PT Time Calculation (min) (ACUTE ONLY): 17 min   Charges:   PT Evaluation $PT Eval Low Complexity: 1 Low          Kreg Shropshire, DPT 10/13/2018, 12:57 PM

## 2018-10-14 MED ORDER — DEXTROSE-NACL 5-0.45 % IV SOLN
INTRAVENOUS | Status: DC
  Administered 2018-10-14 – 2018-10-15 (×2): via INTRAVENOUS

## 2018-10-14 NOTE — Progress Notes (Signed)
  Speech Language Pathology Treatment: Dysphagia  Patient Details Name: Joel Mcconnell MRN: [id number] DOB: [birth year] Today's Date: 10/14/2018 Time: 0910-1000 SLP Time Calculation (min) (ACUTE ONLY): 50 min  Assessment / Plan / Recommendation Clinical Impression  Pt seen today for ongoing assessment of swallowing function and safety w/ recommended dysphagia diet. Pt continues to present w/ declined Cognitive functioning (baseline advanced Dementia) and only minimally engaged w/ SLP w/ mumbled speech after sitting him upright for tx session. Oral care given/attempted; pt often bit on the swab stick but was able to remove some dried saliva/phlegm. Pt did not follow any commands. NSG reported pt orally held po Crushed meds in applesauce last shift.  Pt appears to present w/ Severe oropharyngeal phase dysphagia w/ significant impact on his overall swallow presentation from the advanced Dementia - pt is at increased risk for choking/aspiration thus Pulmonary impact/decline as well as he appears unable to safely meet his Nutrition/Hydration needs orally w/ current presentation.  Pt required MAX verbal/tactile/visual cues in order to attend/follow through w/ po tasks d/t his decreased awareness overall for the task of swallowing --- 1 pharyngeal swallow was appreciated w/ the 5 (1/2) TSP presentations of Honey consistency liquids and Puree(1). Pt opened mouth to receive the po boluses but then exhibited consistent Oral Holding, delayed A-P tranfer, and a munching oral response. One episode of delayed, overt coughing was noted --- suspect related to the prolonged Oral Holding time. With constant MAX cues, pt continued to orally hold the bolus material so SLP used swabs to remove bolus material to prevent choking/aspiration on what was being orally held. Nothing further was given post f/u oral care and bolus removal.  NSG and MD consulted re: pt's presentation and decline status/awareness for safe oral intake.  Recommend NPO status w/ frequent oral care for hygiene and stimulation of swallowing at this time. Recommend medication via alternative means; IV support for hydration d/t NPO status. MD agreed and will f/u w/ Palliative Care re: pt's status. ST services will f/u w/ pt on Monday.     HPI HPI: Pt is a 85 y.o. male with history of advanced dementia here with reported altered mental status.  Patient reportedly is normally more awake and alert per NH where he resides.  He was found this morning and was significantly more confused.  Pt was admitted w/ Dehydration.  He has a legal guardian through [place].       SLP Plan  Consult other service (comment)(updated MD; Palliative care f/u to determine needs) - recommend NPO d/t high risk for aspiration/choking       Recommendations  Diet recommendations: NPO Medication Administration: Via alternative means                Oral Care Recommendations: Oral care QID;Staff/trained caregiver to provide oral care(for hygiene and stimulation of swallowing) Follow up Recommendations: (TBD) SLP Visit Diagnosis: Dysphagia, oropharyngeal phase (R13.12)(Dementia) Plan: Consult other service (comment)(updated MD; Palliative care f/u to determine needs)       GO                  Orinda Kenner, [place], CCC-SLP [facility] 10/14/2018, 5:34 PM

## 2018-10-14 NOTE — Progress Notes (Signed)
[facility] [place] at [facility]   PATIENT NAME: Joel Mcconnell    MR#:  [id number]  DATE OF BIRTH:  1927/08/06  SUBJECTIVE:   Patient came in with increasing confusion. He has advanced dementia resting quietly. . Remains confused at baseline poor PO intake and not following commands as for swallowing. Holding a lot of food in the mouth. High risk for aspiration. Will keep him NPO REVIEW OF SYSTEMS:   Review of Systems  Unable to perform ROS: Dementia   Tolerating Diet:yes Tolerating PT: SNF  DRUG ALLERGIES:  No Known Allergies  VITALS:  Blood pressure (!) 143/75, pulse (!) 59, temperature 98.6 F (37 C), temperature source Oral, resp. rate 16, height 5\' 11"  (1.803 m), weight 71.9 kg, SpO2 96 %.  PHYSICAL EXAMINATION:   Physical Exam  GENERAL:  85 y.o.-year-old patient lying in the bed with no acute distress. weak EYES: Pupils equal, round, reactive to light and accommodation. HEENT: Head atraumatic, normocephalic. Oropharynx and nasopharynx clear.  NECK:  Supple, no jugular venous distention. No thyroid enlargement, no tenderness.  LUNGS: Normal breath sounds bilaterally, no wheezing, rales, rhonchi. No use of accessory muscles of respiration.  CARDIOVASCULAR: S1, S2 normal. No murmurs, rubs, or gallops.  ABDOMEN: Soft, nontender, nondistended. Bowel sounds present. No organomegaly or mass.  EXTREMITIES: No cyanosis, clubbing or edema b/l.    NEUROLOGIC: Cranial nerves II through XII are intact. No focal Motor or sensory deficits b/l.  Grossly non-focal  PSYCHIATRIC:  patient is sleepy--has dementia SKIN: No obvious rash, lesion, or ulcer.   LABORATORY PANEL:  CBC Recent Labs  Lab 10/12/18 0523  WBC 9.3  HGB 12.3*  HCT 38.8*  PLT 153    Chemistries  Recent Labs  Lab 10/11/18 1055 10/12/18 0523  NA 145 146*  K 4.2 4.0  CL 108 112*  CO2 27 26  GLUCOSE 99 90  BUN 27* 21  CREATININE 1.65* 1.30*  CALCIUM 9.3 8.9  AST 148*  --    ALT 24  --   ALKPHOS 66  --   BILITOT 0.9  --    Cardiac Enzymes No results for input(s): TROPONINI in the last 168 hours. RADIOLOGY:  No results found. ASSESSMENT AND PLAN:    Joel Mcconnell  is a 85 y.o. male with a known history of dementia and lives in a nursing facility.  At baseline he is independent in day-to-day activities and talking.  Today the facility found him altered mental status and drowsy and not talking so sent to emergency room.  As per the facility report to given to the EMS and nurse he was having low-grade fever and tachycardia. Patient is confused and cannot give me much detail.  *Altered mental status Likely dehydration with underlying progressive dementia. -COVID-19 test result Negative -Cont IV fluids. -Patient has baseline dementia but he is able to communicate  Some -CT scan head is negative for acute findings. -Patient has no fever. So far he is calm and composed  *Alzheimer's dementia - resume his home meds -likely progressive dementia. Will get palliative care consultation.  *Acute on chronic kidney disease stage III-- mild dehydration Appears to be at baseline, avoid nephrotoxic medication and keep on IV fluids and monitor. -Improving creatinine with IV fluids continue till PO intake improves  *Speech therapy to see patient for swallow eval. Recommends honey thick liquid -patient is holding up a lot of food in his mouth is at a high risk for aspiration and he is not  following commands. Very poor PO intake.  Palliative care has spoken with APS social worker. I did leave a message for Carly. If patient continues to not progress well will consider transition to hospice if okay with APS worker. Will update them on Monday.    Case discussed with Care Management/Social Worker.   CODE STATUS: full DVT Prophylaxis: Heparin  TOTAL TIME TAKING CARE OF THIS PATIENT: *25* minutes.  >50% time spent on counselling and coordination of care  POSSIBLE D/C IN  *few DAYS, DEPENDING ON CLINICAL CONDITION.  Note: This dictation was prepared with Dragon dictation along with smaller phrase technology. Any transcriptional errors that result from this process are unintentional.  Fritzi Mandes M.D on 10/14/2018 at 1:04 PM  Between 7am to 6pm - Pager - [phone]  After 6pm go to [email or web address] - password [email or web address] [place] [facility]  Office  [phone]  CC: Primary care physician; [facility] IncPatient ID: Joel Mcconnell, male   DOB: [birth year], 85 y.o.   MRN: [id number]

## 2018-10-15 ENCOUNTER — Inpatient Hospital Stay: Payer: Medicare Other

## 2018-10-15 LAB — MRSA PCR SCREENING: MRSA by PCR: NEGATIVE

## 2018-10-15 LAB — GLUCOSE, CAPILLARY: Glucose-Capillary: 154 mg/dL — ABNORMAL HIGH (ref 70–99)

## 2018-10-15 MED ORDER — HYDRALAZINE HCL 20 MG/ML IJ SOLN: 10.0000 mg | Freq: Four times a day (QID) | INTRAMUSCULAR | Status: DC | PRN

## 2018-10-15 MED ORDER — MORPHINE SULFATE (PF) 2 MG/ML IV SOLN
1.0000 mg | INTRAVENOUS | Status: DC | PRN
  Administered 2018-10-15: 17:00:00 1 mg via INTRAVENOUS
  Filled 2018-10-15: qty 1

## 2018-10-15 MED ORDER — MORPHINE SULFATE (PF) 2 MG/ML IV SOLN: 1.0000 mg | Freq: Once | INTRAVENOUS | Status: DC

## 2018-10-15 MED ORDER — SCOPOLAMINE 1 MG/3DAYS TD PT72
1.0000 | MEDICATED_PATCH | TRANSDERMAL | Status: DC
  Administered 2018-10-15: 1.5 mg via TRANSDERMAL
  Filled 2018-10-15: qty 1

## 2018-10-15 MED ORDER — LORAZEPAM 2 MG/ML IJ SOLN
0.5000 mg | INTRAMUSCULAR | Status: DC | PRN
  Administered 2018-10-15: 0.5 mg via INTRAVENOUS
  Filled 2018-10-15: qty 1

## 2018-10-15 MED ORDER — FUROSEMIDE 10 MG/ML IJ SOLN
40.0000 mg | Freq: Once | INTRAMUSCULAR | Status: AC
  Administered 2018-10-15: 40 mg via INTRAVENOUS
  Filled 2018-10-15: qty 4

## 2018-10-15 MED ORDER — SODIUM CHLORIDE 0.9 % IV SOLN
3.0000 g | Freq: Four times a day (QID) | INTRAVENOUS | Status: DC
  Administered 2018-10-15: 3 g via INTRAVENOUS
  Filled 2018-10-15: qty 3
  Filled 2018-10-15 (×4): qty 8

## 2018-10-15 NOTE — Significant Event (Signed)
Rapid Response Event Note  Overview: Time Called: 1617 Arrival Time: 1620 Event Type: Respiratory  Initial Focused Assessment:called for RR on patient admitted for Asp pNA in resp distress with decreased 02 sats, DNR  Interventions: 100 NRB, awaiting clarification between MD and on call DSS worker.  Plan of Care (if not transferred): patient made comfort care. Cory Roughen to call for further assistance.  Event Summary: Name of Physician Notified: Sona patel, MD at 1622    at    Outcome: Stayed in room and stabalized, Other (Comment)(made comfort care)  Event End Time: [place]

## 2018-10-15 NOTE — Plan of Care (Signed)
Patient remains confused during shift. Patient lung sounds wet and productive frothy cough. MD notified , fluids stopped and he ordered one time dose of Lasix IV. Patient suctioned and Lasix administered. Will continue to monitor.

## 2018-10-15 NOTE — Progress Notes (Signed)
[place] responded to a RRT. Pt is DNR and needs clearance from DSS on how to move forward with the POC for pt because of guardianship. Ch checked w/ staff to determine the needs for support. No needs at this time.      10/15/18 1700  Clinical Encounter Type  Visited With Patient;Health care provider  Visit Type Code;Critical Care  Referral From Nurse  Consult/Referral To Chaplain  Stress Factors  Patient Stress Factors Family relationships;Health changes;Loss of control;Major life changes  Family Stress Factors None identified

## 2018-10-15 NOTE — Progress Notes (Addendum)
A rapid response was called. The patient's oxygen level had dropped to 82 on 4L and it stayed at 82 on 6L. A non-rebreather mask placed on the patient. Oxygen level improved to 88-89 percent for about 40 minutes then oxygen dropped to 85-86 on the non rebreather. RN has been in contact with ICU charge nurse and suggested to wean patient off the non rebreather to nasal cannula. Oxygen level sustain at 84-85% on 6L. Morphine given and later ativan given for comfort and restless. A scopolamine patch placed behind right ear. The patient is currently on 2L of oxygen via [place]. Family is at the bedside (wife and stepson). Head of bed >30 degrees to prevent aspirations. Oral secretions have decreased. Step son Marya Amsler is okay to be contacted at [phone] is any changed with the patient occur.

## 2018-10-15 NOTE — Progress Notes (Signed)
Patient ID: Joel Mcconnell, male   DOB: Dec 16, 1927, 85 y.o.   MRN: [id number] Called by RN--pt's Respirtaory status declined--requiring NRB. Rapid response was called. Tried to reached [place] with DSS guardianship---not reachable. Called the emergency line for DSS after hours and waiting for DSS CSW on call to reach me.  Pt has poor prognosis. This was discussed with Norwood Levo earlier by me on the phone.

## 2018-10-15 NOTE — Progress Notes (Signed)
MD notified: FYI, oxygen was increased from 3L to 4L as his oxygen level was at 87% on 3L. On 4L current oxygen level is 90%. The rest of his vitals are stable BP 123/73, Temp 98.3, HR 91, Resp 20

## 2018-10-15 NOTE — Progress Notes (Addendum)
[facility] at [place] NAME: Joel Mcconnell    MR#:  [id number]  DATE OF BIRTH:  1927-03-04  SUBJECTIVE:   Patient came in with increasing confusion. He has advanced dementia resting quietly. . Remains confused at baseline poor PO intake and not following commands as for swallowing. Holding a lot of food in the mouth. High risk for aspiration. Will keep him NPO REVIEW OF SYSTEMS:   Review of Systems  Unable to perform ROS: Dementia   Tolerating Diet:yes Tolerating PT: SNF  DRUG ALLERGIES:  No Known Allergies  VITALS:  Blood pressure 123/73, pulse 91, temperature 98.3 F (36.8 C), temperature source Oral, resp. rate 20, height 5\' 11"  (1.803 m), weight 71.9 kg, SpO2 90 %.  PHYSICAL EXAMINATION:   Physical Examlimited  GENERAL:  85 y.o.-year-old patient lying in the bed with no acute distress. weak EYES: Pupils equal, round, reactive to light and accommodation.  HEENT: Head atraumatic, normocephalic. Oropharynx and nasopharynx clear.  NECK:  Supple, no jugular venous distention. No thyroid enlargement, no tenderness.  LUNGS: decreased breath sounds bilaterally, no wheezing, rales,few rhonchi. No use of accessory muscles of respiration.  CARDIOVASCULAR: S1, S2 normal. No murmurs, rubs, or gallops. Tachycardia+ ABDOMEN: Soft, nontender, nondistended. Bowel sounds present. No organomegaly or mass.  EXTREMITIES: No cyanosis, clubbing or edema b/l.    NEUROLOGIC: limited,grossly non focal PSYCHIATRIC:  patient is sleepy--has dementia SKIN: No obvious rash, lesion, or ulcer.   LABORATORY PANEL:  CBC Recent Labs  Lab 10/12/18 0523  WBC 9.3  HGB 12.3*  HCT 38.8*  PLT 153    Chemistries  Recent Labs  Lab 10/11/18 1055 10/12/18 0523  NA 145 146*  K 4.2 4.0  CL 108 112*  CO2 27 26  GLUCOSE 99 90  BUN 27* 21  CREATININE 1.65* 1.30*  CALCIUM 9.3 8.9  AST 148*  --   ALT 24  --   ALKPHOS 66  --   BILITOT 0.9  --     Cardiac Enzymes No results for input(s): TROPONINI in the last 168 hours. RADIOLOGY:  Dg Chest 1 View  Result Date: 10/15/2018 CLINICAL DATA:  Abnormal lung sounds. Coughing. EXAM: CHEST  1 VIEW COMPARISON:  Chest x-rays dated 10/11/2018 and 07/24/2016. FINDINGS: New patchy airspace opacities at the LEFT lung base. No pleural effusion or pneumothorax seen. Stable chronic elevation of the LEFT hemidiaphragm. Heart size and mediastinal contours are stable. Osseous structures about the chest are unremarkable. IMPRESSION: New patchy airspace opacities at the LEFT lung base, suspicious for pneumonia, alternatively aspiration. Electronically Signed   By: Franki Cabot M.D.   On: 10/15/2018 10:15   ASSESSMENT AND PLAN:    Joel Mcconnell  is a 85 y.o. male with a known history of dementia and lives in a nursing facility.  At baseline he is independent in day-to-day activities and talking.  Today the facility found him altered mental status and drowsy and not talking so sent to emergency room.  As per the facility report to given to the EMS and nurse he was having low-grade fever and tachycardia. Patient is confused and cannot give me much detail.  *Altered mental status Likely dehydration with underlying progressive dementia. -COVID-19 test result Negative -Cont IV fluids. -Patient has baseline dementia but he is able to communicate  Some -CT scan head is negative for acute findings. -Patient has no fever. So far he is calm and composed  *Acute hypoxic respiratory failure due to Aspiration Pneumonia -  High risk for aspiration--NPO -IV unasyn for now  *Alzheimer's dementia - resume his home meds -likely progressive dementia. Will get palliative care consultation.  *Acute on chronic kidney disease stage III-- mild dehydration Appears to be at baseline, avoid nephrotoxic medication and keep on IV fluids and monitor. -Improving creatinine with IV fluids continue till PO intake improves  *Speech  therapy to see patient for swallow eval.  -Keep NPO for now -pt has very poor po intake and overall not doing wel.    Palliative care has spoken with APS social Mcconnell. I did leave a message for Joel Mcconnell on Saturday. If patient continues to not progress well will consider transition to hospice if okay with APS Mcconnell. Will update them on Monday.  Spoke with stepson about pneumonia  Case discussed with Care Management/Social Mcconnell.   CODE STATUS: DNR DVT Prophylaxis: Heparin  TOTAL TIME TAKING CARE OF THIS PATIENT: *25* minutes.  >50% time spent on counselling and coordination of care  POSSIBLE D/C IN *few DAYS, DEPENDING ON CLINICAL CONDITION.  Note: This dictation was prepared with Dragon dictation along with smaller phrase technology. Any transcriptional errors that result from this process are unintentional.  Enedina FinnerSona Aariana Shankland M.D on 10/15/2018 at 1:41 PM  Between 7am to 6pm - Pager - [phone]  After 6pm go to [email or web address] - Social research officer, governmentpassword [email or web address]  [facility] [place] [facility]  Office  [phone]  CC: Primary care physician; [facility] ID: Joel Mcconnell, male   DOB: [birth year], 85 y.o.   MRN: [id number]

## 2018-10-15 NOTE — Progress Notes (Addendum)
Patient ID: Joel Mcconnell, male   DOB: March 14, 1927, 85 y.o.   MRN: [id number] Got call from on call DSS CSW Louie Bun  [phone] and I informed about pt's worseing medical condition and overall poor prognosis. She mentioned her supervisor today is Renaldo Fiddler and she will be notified. Per previous discussion by Palliative RN Crystal and Carly DSS given worsening condition pt will now transition to  [place]. Prn morphine, oxygen, scopolamine and ativan if needed. RN will call and inform Norwood Levo and wife

## 2018-10-15 NOTE — Progress Notes (Addendum)
Portable chest x-ray ordered and hydralazine Q6 PRN for systolic IR>443 after RN communicated with MD. Patient to be kept NPO.

## 2018-10-15 NOTE — Progress Notes (Signed)
MD notified: Are you ok with ordering a chest x-ray for this patient and PRN BP meds. Lungs started sounded wet over the night and having a lot of cough. IV fluids stopped. Lasix given. Vitals this morning are BP 138/104 (MAP 114), HR 106, Oxygen can go from 86-91% on 3L.

## 2018-10-16 LAB — CULTURE, BLOOD (SINGLE)
Culture: NO GROWTH
Special Requests: ADEQUATE

## 2018-10-16 NOTE — Progress Notes (Signed)
New referral for [facility] hospice home received from [place]. Patient information faxed to referral. Currently no bed available, Colletta [place] made aware. Writer to follow up with [facility] and [facility] guardian on 9/22. Flo Shanks BSN, RN, [facility] [phone]

## 2018-10-16 NOTE — Progress Notes (Signed)
[facility] - [place] [facility]   PATIENT NAME: Joel Mcconnell    MR#:  [id number]  DATE OF BIRTH:  04-17-1927  SUBJECTIVE:   Currently resting comfortably. Peer RN--suctioning oral secretions. sats stable. No distress REVIEW OF SYSTEMS:   Review of Systems  Unable to perform ROS: Dementia    DRUG ALLERGIES:  No Known Allergies  VITALS:  Blood pressure (!) 95/59, pulse 68, temperature 100.2 F (37.9 C), temperature source Oral, resp. rate (!) 22, height 5\' 11"  (1.803 m), weight 71.9 kg, SpO2 92 %.  PHYSICAL EXAMINATION:   Physical Examlimited--comfort care  GENERAL:  85 y.o.-year-old patient lying in the bed with no acute distress. weak EYES: Pupils equal, round, reactive to light and accommodation.  HEENT: Head atraumatic, normocephalic. Oropharynx and nasopharynx clear at present  LUNGS: decreased breath sounds bilaterally, no wheezing, rales,few rhonchi. No use of accessory muscles of respiration.  CARDIOVASCULAR: S1, S2 normal. No murmurs, rubs, or gallops.  ABDOMEN: Soft, nontender, nondistended. Bowel sounds present. No organomegaly or mass.  NEUROLOGIC:lethargic--non verbal PSYCHIATRIC:  patient is sleepy--has dementia  LABORATORY PANEL:  CBC Recent Labs  Lab 10/12/18 0523  WBC 9.3  HGB 12.3*  HCT 38.8*  PLT 153    Chemistries  Recent Labs  Lab 10/11/18 1055 10/12/18 0523  NA 145 146*  K 4.2 4.0  CL 108 112*  CO2 27 26  GLUCOSE 99 90  BUN 27* 21  CREATININE 1.65* 1.30*  CALCIUM 9.3 8.9  AST 148*  --   ALT 24  --   ALKPHOS 66  --   BILITOT 0.9  --    Cardiac Enzymes No results for input(s): TROPONINI in the last 168 hours. RADIOLOGY:  Dg Chest 1 View  Result Date: 10/15/2018 CLINICAL DATA:  Abnormal lung sounds. Coughing. EXAM: CHEST  1 VIEW COMPARISON:  Chest x-rays dated 10/11/2018 and 07/24/2016. FINDINGS: New patchy airspace opacities at the LEFT lung base. No pleural effusion or pneumothorax seen. Stable  chronic elevation of the LEFT hemidiaphragm. Heart size and mediastinal contours are stable. Osseous structures about the chest are unremarkable. IMPRESSION: New patchy airspace opacities at the LEFT lung base, suspicious for pneumonia, alternatively aspiration. Electronically Signed   By: Bary RichardStan  Maynard M.D.   On: 10/15/2018 10:15   ASSESSMENT AND PLAN:    Joel Mcconnell  is a 85 y.o. male with a known history of dementia and lives in a nursing facility.  At baseline he is independent in day-to-day activities and talking.  Today the facility found him altered mental status and drowsy and not talking so sent to emergency room.  As per the facility report to given to the EMS and nurse he was having low-grade fever and tachycardia. Patient is confused and cannot give me much detail.  *Altered mental status Likely dehydration with underlying progressive dementia. -COVID-19 test result Negative -CT scan head is negative for acute findings. -resting comfortably  *Acute hypoxic respiratory failure due to Aspiration Pneumonia with worsening Respiratory status y'day --Rapid was called.  -Holding up oral secretions--cont prn suction -appears comfortable now. Pt is on COMFORT care - High risk for aspiration--NPO  *Alzheimer's dementia with likely progressive dementia and Failure to thirve -appreciate Palliative input  *Acute on chronic kidney disease stage III-- mild dehydration  *Speech therapy input appreciated -Keep NPO for now -pt has very poor po intake and overall not doing wel.   CM has left VM for DSS SW carly--pt is hospice home appropriate  TOTAL TIME TAKING CARE OF THIS PATIENT: *25* minutes.  >50% time spent on counselling and coordination of care  POSSIBLE D/C to hospice if ok with DSS/guardian  Note: This dictation was prepared with Dragon dictation along with smaller phrase technology. Any transcriptional errors that result from this process are unintentional.  Fritzi Mandes  M.D on 10/16/2018 at 9:25 AM  Between 7am to 6pm - Pager - [phone]  After 6pm go to [email or web address] - password [email or web address] [place] [facility]  Office  [phone]  CC: Primary care physician; [facility] IncPatient ID: Joel Mcconnell, male   DOB: Dec 21, 1927, 85 y.o.   MRN: [id number]

## 2018-10-16 NOTE — Progress Notes (Signed)
Patient in no acute distress throughout the night. Vital signs remain stable, continues on 2 liters nasal cannula with oxygen saturation at 92%. Small amount of brown stool early this morning. Condom cath intact with urine output.  Terrial Rhodes

## 2018-10-16 NOTE — Care Management Important Message (Signed)
Important Message  Patient Details  Name: Joel Mcconnell MRN: [id number] Date of Birth: 1927/12/26   Medicare Important Message Given:  Other (see comment)  Message left with Dorian Furnace, DSS legal guardian, at [phone] to review Medicare IM.  Awaiting callback.   Dannette Barbara 10/16/2018, 2:43 PM

## 2018-10-16 NOTE — TOC Progression Note (Signed)
Transition of Care [facility]) - Progression Note    Patient Details  Name: AMEAR STROJNY MRN: [id number] Date of Birth: [birth year]  Transition of [facility]) CM/SW Contact  Beverly Sessions, RN Phone Number: 10/16/2018, 4:44 PM  Clinical Narrative:    RNCM spoke with Carly DSS SW who discussed the case with her supervisor.  They are in agreement for Hospice home.  They Request hospice home of [facility].  Referral made to [facility] with [facility]   Expected Discharge Plan: [place]    Expected Discharge Plan and Services Expected Discharge Plan: [place] arrangements for the past 2 months: Assisted Living Facility                                       Social Determinants of Health (SDOH) Interventions    Readmission Risk Interventions No flowsheet data found.

## 2018-10-16 NOTE — TOC Progression Note (Signed)
Transition of Care [facility]) - Progression Note    Patient Details  Name: Joel Mcconnell MRN: [id number] Date of Birth: 10-26-1927  Transition of Care [facility]) CM/SW Contact  Beverly Sessions, RN Phone Number: 10/16/2018, 9:16 AM  Clinical Narrative:    Message left for DSS social worker Carley to discuss discharge disposition. Per MD patient comfort care and hospice home appropriate   Expected Discharge Plan: [place]    Expected Discharge Plan and Services Expected Discharge Plan: [place] arrangements for the past 2 months: Assisted Living Facility                                       Social Determinants of Health (SDOH) Interventions    Readmission Risk Interventions No flowsheet data found.

## 2018-10-16 NOTE — Progress Notes (Signed)
SLP Cancellation Note  Patient Details Name: [name] MRN: [id number] DOB: [birth year]   Cancelled treatment:       Reason Eval/Treat Not Completed: Patient not medically ready;Medical issues which prohibited therapy(chart reviewed; consulted MD re: pt's status). Pt was made NPO on Saturday d/t worsening dysphagia; progressive Dementia. Yesterday, pt had a Rapid Response called d/t declining respiratory status. A non-rebreather mask placed on to aid in O2 sats; a scopolamine patch placed to aid in native secretions. Due to the declining O2 sats, aspiration pneumonia, and aspiration of own saliva, pt was transitioned to [place] only w/ prn morphine, oxygen, scopolamine, and ativan if needed.  ST services continues to recommend NPO status d/t degree of oropharyngeal phase dysphagia and risk for aspiration/choking; frequent oral care for hygiene and stimulation of swallowing. ST services will sign off at this time w/ MD to reconsult if indicated. MD agreed.     Orinda Kenner, MS, CCC-SLP Watson,Katherine 10/16/2018, 2:22 PM

## 2018-10-17 MED ORDER — SCOPOLAMINE 1 MG/3DAYS TD PT72: 1.0000 | MEDICATED_PATCH | TRANSDERMAL | 12 refills | Status: AC

## 2018-10-17 MED ORDER — MORPHINE SULFATE (CONCENTRATE) 10 MG/0.5ML PO SOLN: 5.0000 mg | ORAL | 0 refills | Status: AC | PRN

## 2018-10-17 MED ORDER — MORPHINE SULFATE (CONCENTRATE) 10 MG/0.5ML PO SOLN: 5.0000 mg | ORAL | Status: DC | PRN

## 2018-10-17 NOTE — Discharge Summary (Signed)
[place] [facility] [place] NAME: Joel Mcconnell    MR#:  [id number]  DATE OF BIRTH:  Jan 31, 1927  DATE OF ADMISSION:  10/11/2018 ADMITTING PHYSICIAN: Vaughan Basta, MD  DATE OF DISCHARGE: 10/17/2018  PRIMARY CARE PHYSICIAN: [place]    ADMISSION DIAGNOSIS:  RUQ pain [R10.11]  DISCHARGE DIAGNOSIS:  Adult failure to thrive Acute Hypoxic respiratory failure due to Aspiration Pneumonia Advance  Alzheimer's Mcconnell  SECONDARY DIAGNOSIS:   Past Medical History:  Diagnosis Date  . Mcconnell [facility])     HOSPITAL COURSE:   Joel Mcconnell and lives in a nursing facility. At baseline he is independent in day-to-day activities and talking. Today the facility found him altered mental status and drowsy and not talking so sent to emergency room. As per the facility report to given to the EMS and nurse he was having low-grade fever and tachycardia. Patient is confused and cannot give me much detail.  *Acute hypoxic respiratory failure due to Aspiration Pneumonia with worsening Respiratory status -Holding up oral secretions--cont prn suction -appears comfortable now. Pt is on COMFORT care - High risk for aspiration--NPO  *Altered mental status Likely dehydration with underlying progressive Mcconnell. -COVID-19 test result Negative -CT scan head is negative for acute findings. -resting comfortably  *Alzheimer's Mcconnell with likely progressive Mcconnell and Failure to thirve -appreciate Palliative input  *Acute on chronic kidney disease stage III-- mild dehydration  *Speech therapy input appreciated -Keep NPO for now -pt has very poor po intake and overall not doing wel.   CM has left VM for DSS SW carly--pt is hospice home appropriate and will go to Hospice facility once DSS completes paper work.  CONSULTS OBTAINED:    DRUG ALLERGIES:  No Known  Allergies  DISCHARGE MEDICATIONS:   Allergies as of 10/17/2018   No Known Allergies     Medication List    STOP taking these medications   cetirizine 10 MG tablet Commonly known as: ZYRTEC   divalproex 125 MG DR tablet Commonly known as: DEPAKOTE   donepezil 10 MG tablet Commonly known as: ARICEPT   loperamide 2 MG capsule Commonly known as: IMODIUM   Milk of Magnesia 400 MG/5ML suspension Generic drug: magnesium hydroxide   Mintox 301-601-09 MG/5ML suspension Generic drug: alum & mag hydroxide-simeth   QUEtiapine 200 MG tablet Commonly known as: SEROQUEL   risperiDONE 1 MG tablet Commonly known as: RISPERDAL     TAKE these medications   acetaminophen 500 MG tablet Commonly known as: TYLENOL Take 500-1,000 mg by mouth every 6 (six) hours as needed for mild pain or fever.   morphine CONCENTRATE 10 MG/0.5ML Soln concentrated solution Take 0.25 mLs (5 mg total) by mouth every 2 (two) hours as needed for moderate pain, severe pain or shortness of breath.   scopolamine 1 MG/3DAYS Commonly known as: TRANSDERM-SCOP Place 1 patch (1.5 mg total) onto the skin every 3 (three) days. Start taking on: October 18, 2018       If you experience worsening of your admission symptoms, develop shortness of breath, life threatening emergency, suicidal or homicidal thoughts you must seek medical attention immediately by calling 911 or calling your MD immediately  if symptoms less severe.  You Must read complete instructions/literature along with all the possible adverse reactions/side effects for all the Medicines you take and that have been prescribed to you. Take any new Medicines after you have completely understood and accept all the possible  adverse reactions/side effects.   Please note  You were cared for by a hospitalist during your hospital stay. If you have any questions about your discharge medications or the care you received while you were in the hospital after you  are discharged, you can call the unit and asked to speak with the hospitalist on call if the hospitalist that took care of you is not available. Once you are discharged, your primary care physician will handle any further medical issues. Please note that NO REFILLS for any discharge medications will be authorized once you are discharged, as it is imperative that you return to your primary care physician (or establish a relationship with a primary care physician if you do not have one) for your aftercare needs so that they can reassess your need for medications and monitor your lab values. Today   SUBJECTIVE    Resting comfortably VITAL SIGNS:  Blood pressure (!) 100/49, pulse 64, temperature 98.6 F (37 C), temperature source Oral, resp. rate 20, height 5\' 11"  (1.803 m), weight 71.9 kg, SpO2 (!) 88 %.  I/O:    Intake/Output Summary (Last 24 hours) at 10/17/2018 1257 Last data filed at 10/17/2018 0548 Gross per 24 hour  Intake 0 ml  Output 50 ml  Net -50 ml    PHYSICAL EXAMINATION:   Physical Examlimited--comfort care  GENERAL:  85 y.o.-year-old patient lying in the bed with no acute distress. weak EYES: Pupils equal, round, reactive to light and accommodation.  HEENT: Head atraumatic, normocephalic. Oropharynx and nasopharynx clear at present  LUNGS: decreased breath sounds bilaterally, no wheezing, rales,few rhonchi. No use of accessory muscles of respiration.  CARDIOVASCULAR: S1, S2 normal. No murmurs, rubs, or gallops.  ABDOMEN: Soft, nontender, nondistended. Bowel sounds present. No organomegaly or mass.  NEUROLOGIC:lethargic--non verbal PSYCHIATRIC:  patient is lethargic-has baseline  Mcconnell DATA REVIEW:   CBC  Recent Labs  Lab 10/12/18 0523  WBC 9.3  HGB 12.3*  HCT 38.8*  PLT 153    Chemistries  Recent Labs  Lab 10/11/18 1055 10/12/18 0523  NA 145 146*  K 4.2 4.0  CL 108 112*  CO2 27 26  GLUCOSE 99 90  BUN 27* 21  CREATININE 1.65* 1.30*  CALCIUM 9.3  8.9  AST 148*  --   ALT 24  --   ALKPHOS 66  --   BILITOT 0.9  --     Microbiology Results   Recent Results (from the past 240 hour(s))  SARS CORONAVIRUS 2 (TAT 6-24 HRS) Nasopharyngeal Urine, Catheterized     Status: None   Collection Time: 10/11/18 12:24 PM   Specimen: Urine, Catheterized; Nasopharyngeal  Result Value Ref Range Status   SARS Coronavirus 2 NEGATIVE NEGATIVE Final    Comment: (NOTE) SARS-CoV-2 target nucleic acids are NOT DETECTED. The SARS-CoV-2 RNA is generally detectable in upper and lower respiratory specimens during the acute phase of infection. Negative results do not preclude SARS-CoV-2 infection, do not rule out co-infections with other pathogens, and should not be used as the sole basis for treatment or other patient management decisions. Negative results must be combined with clinical observations, patient history, and epidemiological information. The expected result is Negative. Fact Sheet for Patients: HairSlick.no Fact Sheet for Healthcare Providers: quierodirigir.com This test is not yet approved or cleared by the [place] FDA and  has been authorized for detection and/or diagnosis of SARS-CoV-2 by FDA under an Emergency Use Authorization (EUA). This EUA will remain  in effect (meaning this test can be  used) for the duration of the COVID-19 declaration under Section 56 4(b)(1) of the Act, 21 U.S.C. section 360bbb-3(b)(1), unless the authorization is terminated or revoked sooner. Performed at [facility], [address] [place], [place] [address]   Culture, blood (single) w Reflex to ID Panel     Status: None   Collection Time: 10/11/18  4:20 PM   Specimen: BLOOD  Result Value Ref Range Status   Specimen Description BLOOD BLOOD LEFT FOREARM  Final   Special Requests   Final    BOTTLES DRAWN AEROBIC AND ANAEROBIC Blood Culture adequate volume   Culture   Final    NO GROWTH 5  DAYS Performed at [facility], [address] [place], [place] [address]    Report Status 10/16/2018 FINAL  Final  MRSA PCR Screening     Status: None   Collection Time: 10/15/18  5:00 AM   Specimen: Nasopharyngeal  Result Value Ref Range Status   MRSA by PCR NEGATIVE NEGATIVE Final    Comment:        The GeneXpert MRSA Assay (FDA approved for NASAL specimens only), is one component of a comprehensive MRSA colonization surveillance program. It is not intended to diagnose MRSA infection nor to guide or monitor treatment for MRSA infections. Performed at [facility], [address] [place], [place] [address]     RADIOLOGY:  No results found.   CODE STATUS:     Code Status Orders  (From admission, onward)         Start     Ordered   10/13/18 2044  Do not attempt resuscitation (DNR)  Continuous    Question Answer Comment  In the event of cardiac or respiratory ARREST Do not call a "code blue"   In the event of cardiac or respiratory ARREST Do not perform Intubation, CPR, defibrillation or ACLS   In the event of cardiac or respiratory ARREST Use medication by any route, position, wound care, and other measures to relive pain and suffering. May use oxygen, suction and manual treatment of airway obstruction as needed for comfort.      10/13/18 2043        Code Status History    Date Active Date Inactive Code Status Order ID Comments User Context   10/11/2018 1832 10/13/2018 2043 Full Code [id number]  Altamese Dilling, MD Inpatient   03/01/2015 2023 03/02/2015 1824 Full Code [id number]  Lattie Haw, MD ED   Advance Care Planning Activity      TOTAL TIME TAKING CARE OF THIS PATIENT: **40* minutes.    Enedina Finner M.D on 10/17/2018 at 12:57 PM  Between 7am to 6pm - Pager - [phone] After 6pm go to [email or web address] - Social research officer, government  [facility]  Office  [phone]  CC: Primary care physician; [facility]

## 2018-10-17 NOTE — TOC Transition Note (Signed)
Transition of Care [facility]) - CM/SW Discharge Note   Patient Details  Name: Joel Mcconnell MRN: [id number] Date of Birth: Jun 17, 1927  Transition of Care [facility]) CM/SW Contact:  Annamaria Boots, [place] Phone Number: 10/17/2018, 2:39 PM   Clinical Narrative:  Patient will transfer to [place] home today via EMS. Patient's legal guardian Carly at [place] has been notified and consents have been signed. Santiago Glad with [facility] will call report to hospice home.    Final next level of care: [place] Barriers to Discharge: No Barriers Identified   Patient Goals and CMS Choice   CMS Medicare.gov Compare Post Acute Care list provided to:: Legal Guardian Choice offered to / list presented to : [place] / [place]  Discharge Placement                       Discharge Plan and Services                                     Social Determinants of Health (SDOH) Interventions     Readmission Risk Interventions No flowsheet data found.

## 2018-10-17 NOTE — Progress Notes (Signed)
Daily Progress Note   Patient Name: Joel Mcconnell       Date: 10/17/2018 DOB: November 11, 1927  Age: 85 y.o. MRN#: [id number] Attending Physician: Fritzi Mandes, MD Primary Care Physician: [place] Date: 10/11/2018  Reason for Consultation/Follow-up: Psychosocial/spiritual support  Subjective: Patient is resting in bed currently with comfort care in place. No family at bedside.  He is frail.  He does not appear to be in distress. Respirations are even and unlabored. Plans for transfer to hospice facility hopefully today.   Length of Stay: 5  Current Medications: Scheduled Meds:  . scopolamine  1 patch Transdermal Q72H    Continuous Infusions:   PRN Meds: acetaminophen, LORazepam, morphine injection, morphine CONCENTRATE  Physical Exam Constitutional:      Comments: Opens eyes to touch, does not speak.   Pulmonary:     Effort: Pulmonary effort is normal.             Vital Signs: BP (!) 100/49 (BP Location: Right Arm)   Pulse 64   Temp 98.6 F (37 C) (Oral)   Resp 20   Ht 5\' 11"  (1.803 m)   Wt 71.9 kg   SpO2 (!) 88%   BMI 22.11 kg/m  SpO2: SpO2: (!) 88 % O2 Device: O2 Device: Nasal Cannula O2 Flow Rate: O2 Flow Rate (L/min): 2 L/min  Intake/output summary:   Intake/Output Summary (Last 24 hours) at 10/17/2018 1233 Last data filed at 10/17/2018 0548 Gross per 24 hour  Intake 0 ml  Output 50 ml  Net -50 ml   LBM: Last BM Date: (unsure) Baseline Weight: Weight: 75 kg Most recent weight: Weight: 71.9 kg       Palliative Assessment/Data: 10%    Flowsheet Rows     Most Recent Value  Intake Tab  Referral Department  Hospitalist  Unit at Time of Referral  Med/Surg Unit  Palliative Care Primary Diagnosis  Neurology  Date Notified  10/12/18   Palliative Care Type  New Palliative care  Reason for referral  Clarify Goals of Care  Date of Admission  10/11/18  Date first seen by Palliative Care  10/13/18  # of days Palliative referral response time  1 Day(s)  # of days IP prior to Palliative referral  1  Clinical Assessment  Psychosocial & Spiritual Assessment  Palliative Care Outcomes      Patient Active Problem List   Diagnosis Date Noted  . AMS (altered mental status) 10/12/2018  . Altered mental status 10/11/2018  . Dementia with behavioral problem (HCC) 08/05/2016  . Hemoperitoneum 03/01/2015  . Abdominal hematoma   . Abdominal pain   . ANXIETY 09/14/2006  . ERECTILE DYSFUNCTION 09/14/2006  . BENIGN POSITIONAL VERTIGO 09/14/2006  . ALLERGIC RHINITIS 09/14/2006  . SPONDYLOSIS 09/14/2006  . SPINAL STENOSIS 09/14/2006  . COLONIC POLYPS, HX OF 09/14/2006    Palliative Care Assessment & Plan     Recommendations/Plan:  Currently waiting for hospice home.  Patient looks comfortable, no changes to medications.    Code Status:    Code Status Orders  (From admission, onward)         Start     Ordered   10/13/18 2044  Do not attempt resuscitation (DNR)  Continuous    Question Answer Comment  In the event of cardiac or respiratory ARREST Do not call a "code blue"   In the event of cardiac or respiratory ARREST Do not perform Intubation, CPR, defibrillation or ACLS   In the event of cardiac or respiratory ARREST Use medication by any route, position, wound care, and other measures to relive pain and suffering. May use oxygen, suction and manual treatment of airway obstruction as needed for comfort.      10/13/18 2043        Code Status History    Date Active Date Inactive Code Status Order ID Comments User Context   10/11/2018 1832 10/13/2018 2043 Full Code [id number]  Altamese Dilling, MD Inpatient   03/01/2015 2023 03/02/2015 1824 Full Code [id number]  Lattie Haw, MD ED   Advance Care Planning  Activity       Prognosis:   < 2 weeks    Thank you for allowing the Palliative Medicine Team to assist in the care of this patient.   Total Time 15 min Prolonged Time Billed  no      Greater than 50%  of this time was spent counseling and coordinating care related to the above assessment and plan.  Morton Stall, NP  Please contact Palliative Medicine Team phone at [phone] for questions and concerns.

## 2018-10-17 NOTE — Progress Notes (Signed)
Update: Consents have been received from DSS. Hospital team updated, EMS notified for 5 pm transport, discharge summary faxed to referral. Patient seen lying in bed, eyes closed, but did nod in response to writer. DSS is contacting family to notify of transport. Flo Shanks BSN, RN, [facility] [phone]

## 2018-10-17 NOTE — Progress Notes (Signed)
Pt was transferred to hospice. Report was given to hospice nurse as well as EMS who transported patient. IV was left in for the receiving hospice facility. Pt tolerated discharge well. Resting during the process.

## 2018-10-17 NOTE — Progress Notes (Signed)
Visit made. Patient seen lying in bed, eyes closed, no response to verbal stimuli and gentle touch. He remains on 2 liters of oxygen, scopolamine patch in place for management of secretions, respirations unlabored and shallow. Hospital care team notified that a bed is available for transfer to the hospice home today, pending consents from [place] guardian. Report called to the hospice home in anticipation of transfer. Writer to notify EMS for transport when consents are in place. Hospital care team updated. Signed out of facility DNR and MOST form in place.  Flo Shanks BSN, RN, [facility] [phone]

## 2018-10-26 DEATH — deceased
